# Patient Record
Sex: Female | Born: 1935 | Race: White | Hispanic: No | Smoking: Never smoker
Health system: Southern US, Community
[De-identification: ages and names within clinical notes are randomized; demographics above are authoritative.]

## PROBLEM LIST (undated history)

## (undated) DIAGNOSIS — K219 Gastro-esophageal reflux disease without esophagitis: Secondary | ICD-10-CM

## (undated) DIAGNOSIS — I2510 Atherosclerotic heart disease of native coronary artery without angina pectoris: Secondary | ICD-10-CM

## (undated) DIAGNOSIS — E785 Hyperlipidemia, unspecified: Secondary | ICD-10-CM

## (undated) DIAGNOSIS — Z95820 Peripheral vascular angioplasty status with implants and grafts: Secondary | ICD-10-CM

## (undated) DIAGNOSIS — I10 Essential (primary) hypertension: Secondary | ICD-10-CM

## (undated) DIAGNOSIS — M1990 Unspecified osteoarthritis, unspecified site: Secondary | ICD-10-CM

## (undated) DIAGNOSIS — I2109 ST elevation (STEMI) myocardial infarction involving other coronary artery of anterior wall: Secondary | ICD-10-CM

## (undated) HISTORY — DX: Peripheral vascular angioplasty status with implants and grafts: Z95.820

## (undated) HISTORY — DX: Atherosclerotic heart disease of native coronary artery without angina pectoris: I25.10

## (undated) HISTORY — PX: BALLOON ANGIOPLASTY, ARTERY: SHX564

## (undated) HISTORY — DX: Gastro-esophageal reflux disease without esophagitis: K21.9

## (undated) HISTORY — DX: Unspecified osteoarthritis, unspecified site: M19.90

---

## 2007-02-22 ENCOUNTER — Ambulatory Visit (HOSPITAL_BASED_OUTPATIENT_CLINIC_OR_DEPARTMENT_OTHER): Admission: RE | Admit: 2007-02-22 | Discharge: 2007-02-22 | Payer: Self-pay | Admitting: Orthopedic Surgery

## 2010-12-30 NOTE — Op Note (Signed)
NAME:  Sophia Snyder, Sophia Snyder                  ACCOUNT NO.:  [id number]   MEDICAL RECORD NO.:  [id number]          PATIENT TYPE:  AMB   LOCATION:  [place]                          FACILITY:  [facility]   PHYSICIAN:  Rodney A. Mortenson, M.D.DATE OF BIRTH:  [birth year]   DATE OF PROCEDURE:  02/22/2007  DATE OF DISCHARGE:                               OPERATIVE REPORT   PREOPERATIVE DIAGNOSIS:  Bimalleolar fracture right ankle with markedly  comminuted distal fibula fracture.   POSTOPERATIVE DIAGNOSIS:  Bimalleolar fracture right ankle with markedly  comminuted distal fibula fracture.   OPERATION:  Open induction internal fixation using 6-hole lateral side  plate over the fibula and a 30 mm length cannulated screw and washer  through the medial malleolus right ankle.   SURGEON:  Geroge Baseman. Alphonzo Cruise, M.D.   ANESTHESIA:  General.   PROCEDURE:  The patient placed on the table in supine position.  Pneumatic tourniquet the right upper thigh.  Right lower extremity  prepped with DuraPrep and draped in usual manner.  Leg was wrapped out  with Esmarch and tourniquet was elevated.  Attention was first turned to  the fibula.  Incision made down the fibula down to the tip of the  fibula.  This all done under radiograph control.  There is a markedly  comminuted fracture fibula at the level of the joint line.  There was a  large fragment with articular cartilage which was displaced out of the  joint.  This was placed back in anatomic position.  A six-hole side  plate was put in position, held in place with clamps.  The fracture was  reduced and stabilized.  Drill holes made and appropriate length screws  were then inserted.  Excellent fixation of the fracture was achieved and  repositioning of the fracture fragment of the fibula with articular  cartilage was repositioned in anatomic position.  This was done to my  satisfaction. Attention was turned to the medial side of the ankle.  Incision made over medial  malleolus but this was then reduced  anatomically with a small bone fragments were seen in the knee joint.  These were removed carefully.  With the medial malleolus in a reduced  position a guide wire was passed through the tip of the fibula up into  the distal tibia, and this was all done under radiographic control.  The  cannulated drill was then placed over the guide pin and a 30 mm  cannulated screw, washer was passed over the guide pin and this reduced  the fracture anatomically, gave excellent stabilization.  Follow-up x-  rays showed anatomic reduction of fractures.  The 2-0 Vicryl used to  close subcutaneous tissue, stainless steel staples used to close skin.  Sterile dressings, large bulky pressure dressing and plaster splints  were applied and the patient returned to recovery room in nice  condition.  Technically this went extremely well.  Drains none.  Complications none.           ______________________________  Geroge Baseman. Alphonzo Cruise, M.D.     RAM/MEDQ  D:  02/22/2007  T:  02/22/2007  Job:  [id number]

## 2011-06-02 LAB — I-STAT 8, (EC8 V) (CONVERTED LAB)
BUN: 17
Chloride: 106
pCO2, Ven: 40.6 — ABNORMAL LOW
pH, Ven: 7.405 — ABNORMAL HIGH

## 2014-10-25 DIAGNOSIS — Z23 Encounter for immunization: Secondary | ICD-10-CM | POA: Diagnosis not present

## 2014-10-25 DIAGNOSIS — Z9181 History of falling: Secondary | ICD-10-CM | POA: Diagnosis not present

## 2014-10-25 DIAGNOSIS — Z0001 Encounter for general adult medical examination with abnormal findings: Secondary | ICD-10-CM | POA: Diagnosis not present

## 2014-10-25 DIAGNOSIS — Z79899 Other long term (current) drug therapy: Secondary | ICD-10-CM | POA: Diagnosis not present

## 2014-10-25 DIAGNOSIS — Z2821 Immunization not carried out because of patient refusal: Secondary | ICD-10-CM | POA: Diagnosis not present

## 2015-06-20 DIAGNOSIS — J3089 Other allergic rhinitis: Secondary | ICD-10-CM | POA: Diagnosis not present

## 2015-06-20 DIAGNOSIS — I10 Essential (primary) hypertension: Secondary | ICD-10-CM | POA: Diagnosis not present

## 2015-06-20 DIAGNOSIS — Z1389 Encounter for screening for other disorder: Secondary | ICD-10-CM | POA: Diagnosis not present

## 2015-06-20 DIAGNOSIS — N183 Chronic kidney disease, stage 3 (moderate): Secondary | ICD-10-CM | POA: Diagnosis not present

## 2015-12-28 ENCOUNTER — Encounter (HOSPITAL_COMMUNITY): Payer: Self-pay | Admitting: Cardiovascular Disease

## 2015-12-28 ENCOUNTER — Encounter (HOSPITAL_COMMUNITY)
Admission: EM | Disposition: A | Discharge status: Home / Self Care | Payer: Self-pay | Source: Other Acute Inpatient Hospital | Attending: Cardiovascular Disease

## 2015-12-28 ENCOUNTER — Emergency Department (HOSPITAL_COMMUNITY): Admission: EM | Admit: 2015-12-28 | Payer: Self-pay | Admitting: Emergency Medicine

## 2015-12-28 ENCOUNTER — Inpatient Hospital Stay (HOSPITAL_COMMUNITY)
Admission: EM | Admit: 2015-12-28 | Discharge: 2016-01-01 | DRG: 246 | Disposition: A | Discharge status: Home / Self Care | Payer: Medicare Other | Source: Other Acute Inpatient Hospital | Attending: Cardiovascular Disease | Admitting: Cardiovascular Disease

## 2015-12-28 DIAGNOSIS — N179 Acute kidney failure, unspecified: Secondary | ICD-10-CM | POA: Diagnosis not present

## 2015-12-28 DIAGNOSIS — I2510 Atherosclerotic heart disease of native coronary artery without angina pectoris: Secondary | ICD-10-CM | POA: Diagnosis not present

## 2015-12-28 DIAGNOSIS — R0602 Shortness of breath: Secondary | ICD-10-CM

## 2015-12-28 DIAGNOSIS — I2582 Chronic total occlusion of coronary artery: Secondary | ICD-10-CM | POA: Diagnosis present

## 2015-12-28 DIAGNOSIS — Z7982 Long term (current) use of aspirin: Secondary | ICD-10-CM

## 2015-12-28 DIAGNOSIS — I5021 Acute systolic (congestive) heart failure: Secondary | ICD-10-CM | POA: Diagnosis present

## 2015-12-28 DIAGNOSIS — Z955 Presence of coronary angioplasty implant and graft: Secondary | ICD-10-CM | POA: Diagnosis not present

## 2015-12-28 DIAGNOSIS — I255 Ischemic cardiomyopathy: Secondary | ICD-10-CM | POA: Diagnosis not present

## 2015-12-28 DIAGNOSIS — J439 Emphysema, unspecified: Secondary | ICD-10-CM | POA: Diagnosis present

## 2015-12-28 DIAGNOSIS — I252 Old myocardial infarction: Secondary | ICD-10-CM

## 2015-12-28 DIAGNOSIS — I213 ST elevation (STEMI) myocardial infarction of unspecified site: Secondary | ICD-10-CM | POA: Diagnosis not present

## 2015-12-28 DIAGNOSIS — I2109 ST elevation (STEMI) myocardial infarction involving other coronary artery of anterior wall: Principal | ICD-10-CM

## 2015-12-28 DIAGNOSIS — I110 Hypertensive heart disease with heart failure: Secondary | ICD-10-CM | POA: Diagnosis present

## 2015-12-28 DIAGNOSIS — I2102 ST elevation (STEMI) myocardial infarction involving left anterior descending coronary artery: Secondary | ICD-10-CM

## 2015-12-28 DIAGNOSIS — I5022 Chronic systolic (congestive) heart failure: Secondary | ICD-10-CM

## 2015-12-28 DIAGNOSIS — E785 Hyperlipidemia, unspecified: Secondary | ICD-10-CM | POA: Diagnosis present

## 2015-12-28 DIAGNOSIS — I25110 Atherosclerotic heart disease of native coronary artery with unstable angina pectoris: Secondary | ICD-10-CM | POA: Diagnosis not present

## 2015-12-28 DIAGNOSIS — I10 Essential (primary) hypertension: Secondary | ICD-10-CM | POA: Diagnosis present

## 2015-12-28 DIAGNOSIS — R079 Chest pain, unspecified: Secondary | ICD-10-CM | POA: Diagnosis present

## 2015-12-28 DIAGNOSIS — I5020 Unspecified systolic (congestive) heart failure: Secondary | ICD-10-CM

## 2015-12-28 DIAGNOSIS — I509 Heart failure, unspecified: Secondary | ICD-10-CM

## 2015-12-28 DIAGNOSIS — Z95820 Peripheral vascular angioplasty status with implants and grafts: Secondary | ICD-10-CM | POA: Insufficient documentation

## 2015-12-28 HISTORY — DX: Old myocardial infarction: I25.2

## 2015-12-28 HISTORY — DX: Hyperlipidemia, unspecified: E78.5

## 2015-12-28 HISTORY — DX: ST elevation (STEMI) myocardial infarction involving other coronary artery of anterior wall: I21.09

## 2015-12-28 HISTORY — PX: CARDIAC CATHETERIZATION: SHX172

## 2015-12-28 HISTORY — DX: Essential (primary) hypertension: I10

## 2015-12-28 HISTORY — DX: Peripheral vascular angioplasty status with implants and grafts: Z95.820

## 2015-12-28 LAB — CREATININE, SERUM
Creatinine, Ser: 1.2 mg/dL — ABNORMAL HIGH (ref 0.44–1.00)
GFR calc Af Amer: 43 mL/min — ABNORMAL LOW (ref >60)
GFR calc non Af Amer: 37 mL/min — ABNORMAL LOW (ref >60)

## 2015-12-28 LAB — POCT I-STAT, CHEM 8
BUN: 17 mg/dL (ref 6–20)
CALCIUM ION: 1.18 mmol/L (ref 1.13–1.30)
CHLORIDE: 104 mmol/L (ref 101–111)
CREATININE: 1.1 mg/dL — AB (ref 0.44–1.00)
Glucose, Bld: 160 mg/dL — ABNORMAL HIGH (ref 65–99)
HEMATOCRIT: 36 % (ref 36.0–46.0)
Hemoglobin: 12.2 g/dL (ref 12.0–15.0)
Potassium: 4 mmol/L (ref 3.5–5.1)
SODIUM: 137 mmol/L (ref 135–145)
TCO2: 22 mmol/L (ref 0–100)

## 2015-12-28 LAB — COMPREHENSIVE METABOLIC PANEL
ALBUMIN: 3.7 g/dL (ref 3.5–5.0)
ALK PHOS: 59 U/L (ref 38–126)
ALT: 13 U/L — ABNORMAL LOW (ref 14–54)
AST: 36 U/L (ref 15–41)
Anion gap: 13 (ref 5–15)
BILIRUBIN TOTAL: 0.6 mg/dL (ref 0.3–1.2)
BUN: 17 mg/dL (ref 6–20)
CALCIUM: 9 mg/dL (ref 8.9–10.3)
CO2: 20 mmol/L — ABNORMAL LOW (ref 22–32)
CREATININE: 1.13 mg/dL — AB (ref 0.44–1.00)
Chloride: 103 mmol/L (ref 101–111)
GFR calc Af Amer: 47 mL/min — ABNORMAL LOW (ref >60)
GFR calc non Af Amer: 40 mL/min — ABNORMAL LOW (ref >60)
GLUCOSE: 163 mg/dL — AB (ref 65–99)
Potassium: 3.9 mmol/L (ref 3.5–5.1)
Sodium: 136 mmol/L (ref 135–145)
TOTAL PROTEIN: 6.8 g/dL (ref 6.5–8.1)

## 2015-12-28 LAB — LIPID PANEL
CHOL/HDL RATIO: 5.2 ratio
Cholesterol: 256 mg/dL — ABNORMAL HIGH (ref 0–200)
HDL: 49 mg/dL (ref >40)
LDL CALC: 181 mg/dL — AB (ref 0–99)
Triglycerides: 130 mg/dL (ref <150)
VLDL: 26 mg/dL (ref 0–40)

## 2015-12-28 LAB — CBC
HEMATOCRIT: 34.9 % — AB (ref 36.0–46.0)
HEMATOCRIT: 36.3 % (ref 36.0–46.0)
HEMOGLOBIN: 11.5 g/dL — AB (ref 12.0–15.0)
Hemoglobin: 12 g/dL (ref 12.0–15.0)
MCH: 30.3 pg (ref 26.0–34.0)
MCH: 30.9 pg (ref 26.0–34.0)
MCHC: 33 g/dL (ref 30.0–36.0)
MCHC: 33.1 g/dL (ref 30.0–36.0)
MCV: 91.8 fL (ref 78.0–100.0)
MCV: 93.6 fL (ref 78.0–100.0)
Platelets: 239 10*3/uL (ref 150–400)
Platelets: 199 10*3/uL (ref 150–400)
RBC: 3.8 MIL/uL — ABNORMAL LOW (ref 3.87–5.11)
RBC: 3.88 MIL/uL (ref 3.87–5.11)
RDW: 13.6 % (ref 11.5–15.5)
RDW: 13.7 % (ref 11.5–15.5)
WBC: 7.9 10*3/uL (ref 4.0–10.5)
WBC: 10.3 10*3/uL (ref 4.0–10.5)

## 2015-12-28 LAB — MRSA PCR SCREENING: MRSA by PCR: NEGATIVE

## 2015-12-28 LAB — PROTIME-INR
INR: 1.16 (ref 0.00–1.49)
Prothrombin Time: 15 seconds (ref 11.6–15.2)

## 2015-12-28 LAB — TROPONIN I: TROPONIN I: 0.87 ng/mL — AB (ref <0.031)

## 2015-12-28 LAB — CK TOTAL AND CKMB (NOT AT ARMC)
CK, MB: 19.8 ng/mL — ABNORMAL HIGH (ref 0.5–5.0)
Relative Index: 10.4 — ABNORMAL HIGH (ref 0.0–2.5)
Total CK: 190 U/L (ref 38–234)

## 2015-12-28 LAB — TSH: TSH: 4.671 u[IU]/mL — ABNORMAL HIGH (ref 0.350–4.500)

## 2015-12-28 LAB — POCT ACTIVATED CLOTTING TIME: Activated Clotting Time: 498 seconds

## 2015-12-28 LAB — BRAIN NATRIURETIC PEPTIDE: B NATRIURETIC PEPTIDE 5: 283.3 pg/mL — AB (ref 0.0–100.0)

## 2015-12-28 LAB — APTT: aPTT: >200 seconds (ref 24–37)

## 2015-12-28 SURGERY — LEFT HEART CATH AND CORONARY ANGIOGRAPHY
Anesthesia: LOCAL

## 2015-12-28 MED ORDER — ASPIRIN EC 81 MG PO TBEC
81.0000 mg | DELAYED_RELEASE_TABLET | Freq: Every day | ORAL | Status: DC
  Administered 2015-12-29 – 2016-01-01 (×4): 81 mg via ORAL
  Filled 2015-12-28 (×5): qty 1

## 2015-12-28 MED ORDER — IOPAMIDOL (ISOVUE-370) INJECTION 76%
INTRAVENOUS | Status: DC | PRN
  Administered 2015-12-28: 180 mL

## 2015-12-28 MED ORDER — FENTANYL CITRATE (PF) 100 MCG/2ML IJ SOLN
INTRAMUSCULAR | Status: DC | PRN
  Administered 2015-12-28 (×2): 25 ug via INTRAVENOUS

## 2015-12-28 MED ORDER — IOPAMIDOL (ISOVUE-370) INJECTION 76%
INTRAVENOUS | Status: AC
  Filled 2015-12-28: qty 200

## 2015-12-28 MED ORDER — NITROGLYCERIN 1 MG/10 ML FOR IR/CATH LAB
INTRA_ARTERIAL | Status: AC
  Filled 2015-12-28: qty 10

## 2015-12-28 MED ORDER — HEPARIN SODIUM (PORCINE) 1000 UNIT/ML IJ SOLN
INTRAMUSCULAR | Status: DC | PRN
  Administered 2015-12-28: 4000 [IU] via INTRAVENOUS

## 2015-12-28 MED ORDER — VERAPAMIL HCL 2.5 MG/ML IV SOLN
INTRAVENOUS | Status: DC | PRN
  Administered 2015-12-28: 100 ug via INTRACORONARY
  Administered 2015-12-28 (×3): 150 ug via INTRACORONARY

## 2015-12-28 MED ORDER — ONDANSETRON HCL 4 MG/2ML IJ SOLN
4.0000 mg | Freq: Four times a day (QID) | INTRAMUSCULAR | Status: DC | PRN
  Administered 2015-12-29: 4 mg via INTRAVENOUS
  Filled 2015-12-28: qty 2

## 2015-12-28 MED ORDER — LISINOPRIL 10 MG PO TABS
10.0000 mg | ORAL_TABLET | Freq: Every day | ORAL | Status: DC
  Administered 2015-12-28 – 2015-12-30 (×4): 10 mg via ORAL
  Filled 2015-12-28 (×4): qty 1

## 2015-12-28 MED ORDER — MORPHINE SULFATE (PF) 2 MG/ML IV SOLN: 1.0000 mg | INTRAVENOUS | Status: DC | PRN

## 2015-12-28 MED ORDER — SODIUM CHLORIDE 0.9% FLUSH
3.0000 mL | Freq: Two times a day (BID) | INTRAVENOUS | Status: DC
  Administered 2015-12-29 – 2016-01-01 (×7): 3 mL via INTRAVENOUS

## 2015-12-28 MED ORDER — ATORVASTATIN CALCIUM 80 MG PO TABS
80.0000 mg | ORAL_TABLET | Freq: Every day | ORAL | Status: DC
  Administered 2015-12-29 – 2015-12-31 (×3): 80 mg via ORAL
  Filled 2015-12-28 (×3): qty 1

## 2015-12-28 MED ORDER — ONDANSETRON HCL 4 MG/2ML IJ SOLN
INTRAMUSCULAR | Status: DC | PRN
  Administered 2015-12-28: 4 mg via INTRAVENOUS

## 2015-12-28 MED ORDER — MIDAZOLAM HCL 2 MG/2ML IJ SOLN
INTRAMUSCULAR | Status: AC
  Filled 2015-12-28: qty 2

## 2015-12-28 MED ORDER — SODIUM CHLORIDE 0.9 % IV SOLN: 250.0000 mL | INTRAVENOUS | Status: DC | PRN

## 2015-12-28 MED ORDER — ASPIRIN 300 MG RE SUPP: 300.0000 mg | RECTAL | Status: DC

## 2015-12-28 MED ORDER — VERAPAMIL HCL 2.5 MG/ML IV SOLN
INTRAVENOUS | Status: AC
  Filled 2015-12-28: qty 2

## 2015-12-28 MED ORDER — ONDANSETRON HCL 4 MG/2ML IJ SOLN
INTRAMUSCULAR | Status: AC
  Filled 2015-12-28: qty 2

## 2015-12-28 MED ORDER — NITROGLYCERIN 0.4 MG SL SUBL
0.4000 mg | SUBLINGUAL_TABLET | SUBLINGUAL | Status: DC | PRN
Start: 2015-12-28 — End: 2016-01-01

## 2015-12-28 MED ORDER — NITROGLYCERIN 1 MG/10 ML FOR IR/CATH LAB
INTRA_ARTERIAL | Status: DC | PRN
  Administered 2015-12-28: 150 ug via INTRACORONARY

## 2015-12-28 MED ORDER — HEPARIN (PORCINE) IN NACL 2-0.9 UNIT/ML-% IJ SOLN
INTRAMUSCULAR | Status: AC
  Filled 2015-12-28: qty 1500

## 2015-12-28 MED ORDER — MIDAZOLAM HCL 2 MG/2ML IJ SOLN
INTRAMUSCULAR | Status: DC | PRN
  Administered 2015-12-28 (×2): 1 mg via INTRAVENOUS

## 2015-12-28 MED ORDER — CLOPIDOGREL BISULFATE 300 MG PO TABS
ORAL_TABLET | ORAL | Status: DC | PRN
  Administered 2015-12-28: 600 mg via ORAL

## 2015-12-28 MED ORDER — SODIUM CHLORIDE 0.9% FLUSH: 3.0000 mL | INTRAVENOUS | Status: DC | PRN

## 2015-12-28 MED ORDER — BIVALIRUDIN 250 MG IV SOLR
INTRAVENOUS | Status: AC
Start: 2015-12-28 — End: 2015-12-28
  Filled 2015-12-28: qty 250

## 2015-12-28 MED ORDER — OXYCODONE-ACETAMINOPHEN 5-325 MG PO TABS: 1.0000 | ORAL_TABLET | ORAL | Status: DC | PRN

## 2015-12-28 MED ORDER — ENOXAPARIN SODIUM 30 MG/0.3ML ~~LOC~~ SOLN
30.0000 mg | SUBCUTANEOUS | Status: DC
  Administered 2015-12-29 – 2016-01-01 (×4): 30 mg via SUBCUTANEOUS
  Filled 2015-12-28 (×4): qty 0.3

## 2015-12-28 MED ORDER — FENTANYL CITRATE (PF) 100 MCG/2ML IJ SOLN
INTRAMUSCULAR | Status: AC
Start: 2015-12-28 — End: 2015-12-28
  Filled 2015-12-28: qty 2

## 2015-12-28 MED ORDER — HEPARIN (PORCINE) IN NACL 2-0.9 UNIT/ML-% IJ SOLN
INTRAMUSCULAR | Status: DC | PRN
  Administered 2015-12-28: 16:00:00

## 2015-12-28 MED ORDER — LIDOCAINE HCL (PF) 1 % IJ SOLN
INTRAMUSCULAR | Status: DC | PRN
  Administered 2015-12-28: 4 mL

## 2015-12-28 MED ORDER — SODIUM CHLORIDE 0.9% FLUSH
3.0000 mL | Freq: Two times a day (BID) | INTRAVENOUS | Status: DC
  Administered 2015-12-28 – 2016-01-01 (×7): 3 mL via INTRAVENOUS

## 2015-12-28 MED ORDER — BIVALIRUDIN BOLUS VIA INFUSION - CUPID
INTRAVENOUS | Status: DC | PRN
  Administered 2015-12-28: 45 mg via INTRAVENOUS

## 2015-12-28 MED ORDER — SODIUM CHLORIDE 0.9 % IV SOLN
250.0000 mg | INTRAVENOUS | Status: DC | PRN
  Administered 2015-12-28: 1.75 mg/kg/h via INTRAVENOUS

## 2015-12-28 MED ORDER — CLOPIDOGREL BISULFATE 75 MG PO TABS
75.0000 mg | ORAL_TABLET | Freq: Every day | ORAL | Status: DC
  Administered 2015-12-29 – 2016-01-01 (×4): 75 mg via ORAL
  Filled 2015-12-28 (×4): qty 1

## 2015-12-28 MED ORDER — CLOPIDOGREL BISULFATE 300 MG PO TABS
ORAL_TABLET | ORAL | Status: AC
  Filled 2015-12-28: qty 2

## 2015-12-28 MED ORDER — ASPIRIN 81 MG PO CHEW: 324.0000 mg | CHEWABLE_TABLET | ORAL | Status: DC

## 2015-12-28 MED ORDER — HEPARIN SODIUM (PORCINE) 1000 UNIT/ML IJ SOLN
INTRAMUSCULAR | Status: AC
  Filled 2015-12-28: qty 1

## 2015-12-28 MED ORDER — VERAPAMIL HCL 2.5 MG/ML IV SOLN
INTRAVENOUS | Status: DC | PRN
  Administered 2015-12-28: 10 mL via INTRA_ARTERIAL

## 2015-12-28 MED ORDER — ACETAMINOPHEN 325 MG PO TABS
650.0000 mg | ORAL_TABLET | ORAL | Status: DC | PRN
  Administered 2015-12-28: 650 mg via ORAL
  Filled 2015-12-28: qty 2

## 2015-12-28 SURGICAL SUPPLY — 28 items
BALLN EMERGE MR 2.0X12 (BALLOONS) ×2
BALLN EMERGE MR 2.0X15 (BALLOONS) ×2
BALLN ~~LOC~~ EMERGE MR 3.25X20 (BALLOONS) ×2
BALLN ~~LOC~~ EUPHORA RX 2.75X20 (BALLOONS) ×2
BALLOON EMERGE MR 2.0X12 (BALLOONS) ×1 IMPLANT
BALLOON EMERGE MR 2.0X15 (BALLOONS) ×1 IMPLANT
BALLOON ~~LOC~~ EMERGE MR 3.25X20 (BALLOONS) ×1 IMPLANT
BALLOON ~~LOC~~ EUPHORA RX 2.75X20 (BALLOONS) ×1 IMPLANT
CATH INFINITI 5FR ANG PIGTAIL (CATHETERS) ×2 IMPLANT
CATH INFINITI JR4 5F (CATHETERS) ×2 IMPLANT
CATH MICROGUIDE FINCRSS 150 CM (MICROCATHETER) ×1 IMPLANT
CATH VISTA GUIDE 6FR XBLAD3.5 (CATHETERS) ×2 IMPLANT
DEVICE RAD COMP TR BAND LRG (VASCULAR PRODUCTS) ×2 IMPLANT
ELECT DEFIB PAD ADLT CADENCE (PAD) ×2 IMPLANT
GLIDESHEATH SLEND SS 6F .021 (SHEATH) ×2 IMPLANT
KIT ENCORE 26 ADVANTAGE (KITS) ×2 IMPLANT
KIT HEART LEFT (KITS) ×2 IMPLANT
MICROGUIDE FINECROSS 150 CM (MICROCATHETER) ×2
PACK CARDIAC CATHETERIZATION (CUSTOM PROCEDURE TRAY) ×2 IMPLANT
STENT SYNERGY DES 2.5X32 (Permanent Stent) ×2 IMPLANT
STENT SYNERGY DES 3X28 (Permanent Stent) ×2 IMPLANT
SYR MEDRAD MARK V 150ML (SYRINGE) ×2 IMPLANT
TRANSDUCER W/STOPCOCK (MISCELLANEOUS) ×2 IMPLANT
TUBING CIL FLEX 10 FLL-RA (TUBING) ×2 IMPLANT
WIRE COUGAR XT STRL 190CM (WIRE) ×2 IMPLANT
WIRE HI TORQ VERSACORE-J 145CM (WIRE) ×2 IMPLANT
WIRE HI TORQ WHISPER MS 190CM (WIRE) ×2 IMPLANT
WIRE SAFE-T 1.5MM-J .035X260CM (WIRE) ×2 IMPLANT

## 2015-12-28 NOTE — H&P (Signed)
History and Physical  Patient ID: Sophia Snyder MRN: [id number], SOB: 02-19-1919 82 y.o. Date of Encounter: 12/28/2015, 6:30 PM  Primary Physician: [facility] Primary Cardiologist: none  Chief Complaint: Chest pain  HPI: 82 y.o. female w/ PMHx significant for HTN who presented to [facility] on 12/28/2015 with complaints of chest pain. She is brought in by [facility] as a Code STEMI.  The patient began developing substernal chest discomfort 2 days ago. She initially thought she had a cold. Symptoms were intermittent. This morning she developed severe substernal discomfort associated with diaphoresis and she called EMS. Upon their evaluation, her EKG showed ST segment changes consistent with an acute anterior STEMI. A code STEMI is called and she is brought directly to the cardiac catheterization lab. I evaluated the patient on arrival to the hospital. She continues to complain of 5/10 chest pain. She lives independently with her grandson. 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 She has been in generally good health. She takes medication for hypertension and she takes a daily aspirin.  The patient denies dyspnea, orthopnea, PND, lightheadedness, or syncope.  Past Medical History  Diagnosis Date  . ST elevation myocardial infarction (STEMI) of anterior wall ([place]) 12/28/2015    Surgical History: History reviewed. No pertinent past surgical history.   Home Meds: Prior to Admission medications   Not on File   Allergies: Not on File  Social History   Social History  . Marital Status: Widowed    Spouse Name: N/A  . Number of Children: N/A  . Years of Education: N/A   Occupational History  . Not on file.   Social History Main Topics  . Smoking status: Never Smoker   . Smokeless tobacco: Not on file  . Alcohol Use: No  . Drug Use: No  . Sexual Activity: No   Other Topics Concern  . Not on file   Social History Narrative  . No  narrative on file     Family history: 2 brothers had heart problems, specifics unknown  Review of Systems: General: negative for chills, fever, night sweats or weight changes.  ENT: negative for rhinorrhea or epistaxis Cardiovascular: see HPI Dermatological: negative for rash Respiratory: negative for cough or wheezing GI: negative for nausea, vomiting, diarrhea, bright red blood per rectum, melena, or hematemesis GU: no hematuria, urgency, or frequency Neurologic: negative for visual changes, syncope, headache, or dizziness Heme: no easy bruising or bleeding Endo: negative for excessive thirst, thyroid disorder, or flushing Musculoskeletal: positive for back pain All other systems reviewed and are otherwise negative except as noted above.  Physical Exam: Blood pressure 129/88, pulse 86, resp. rate 20, height 5\' 1"  (1.549 m), weight 140 lb 3.4 oz (63.6 kg), SpO2 91 %. General: Well developed, well nourished, alert and oriented, in no acute distress. HEENT: Normocephalic, atraumatic, sclera anicteric Neck: Supple. Carotids 2+ without bruits. JVP normal Lungs: Clear bilaterally to auscultation without wheezes, rales, or rhonchi. Breathing is unlabored. Heart: RRR with normal S1 and S2. No murmurs, rubs, or gallops appreciated. Abdomen: Soft, non-tender, non-distended with normoactive bowel sounds. No hepatomegaly. No rebound/guarding. No obvious abdominal masses. Back: No CVA tenderness Msk:  Strength and tone appear normal for age. Extremities: No clubbing, cyanosis, or edema.  Distal pedal pulses are 2+ and equal bilaterally. Neuro: CNII-XII intact, moves all extremities spontaneously. Psych:  Responds to questions appropriately with a normal affect. Skin: warm and dry without rash  Labs:   Lab Results  Component Value Date   WBC 7.9 12/28/2015   HGB 11.5* 12/28/2015   HCT 34.9* 12/28/2015   MCV 91.8 12/28/2015   PLT 239 12/28/2015     Recent Labs Lab 12/28/15 1545    NA 136  K 3.9  CL 103  CO2 20*  BUN 17  CREATININE 1.13*  CALCIUM 9.0  PROT 6.8  BILITOT 0.6  ALKPHOS 59  ALT 13*  AST 36  GLUCOSE 163*    Recent Labs  12/28/15 1545  CKTOTAL 190  CKMB 19.8*  TROPONINI 0.87*   Lab Results  Component Value Date   CHOL 256* 12/28/2015   HDL 49 12/28/2015   LDLCALC 181* 12/28/2015   TRIG 130 12/28/2015   No results found for: DDIMER  Radiology/Studies:  No results found.   EKG: Normal sinus rhythm with acute anterior injury pattern  CARDIAC STUDIES: Pending  ASSESSMENT AND PLAN:  Acute anterior STEMI: The patient is an elderly but functional independent woman who clearly has evidence of ongoing anterior MI. She currently has 5/10 chest pain. We discussed treatment options with the indication for cardiac catheterization and primary PCI. Emergency implied consent is obtained. The patient understands the risks, potential benefits, and alternatives. I tried to call her daughter but the number listed was a wrong number. EMS reports discussion with her daughter who understood that the patient was being brought to the cardiac catheterization lab directly. We are still awaiting her arrival.  The patient will be given 4000 units unfractionated heparin. She is given clopidogrel 600 mg. She has received aspirin. Further plans/disposition pending cardiac catheterization results. Post MI medical therapy will be initiated.  Deatra James MD 12/28/2015, 6:30 PM

## 2015-12-29 ENCOUNTER — Inpatient Hospital Stay (HOSPITAL_COMMUNITY): Payer: Medicare Other

## 2015-12-29 DIAGNOSIS — I5021 Acute systolic (congestive) heart failure: Secondary | ICD-10-CM

## 2015-12-29 LAB — LIPID PANEL
CHOL/HDL RATIO: 4.9 ratio
CHOLESTEROL: 257 mg/dL — AB (ref 0–200)
HDL: 52 mg/dL (ref >40)
LDL Cholesterol: 178 mg/dL — ABNORMAL HIGH (ref 0–99)
TRIGLYCERIDES: 134 mg/dL (ref <150)
VLDL: 27 mg/dL (ref 0–40)

## 2015-12-29 LAB — TROPONIN I: Troponin I: >65 ng/mL (ref <0.031)

## 2015-12-29 LAB — CBC
HCT: 36.6 % (ref 36.0–46.0)
Hemoglobin: 12 g/dL (ref 12.0–15.0)
MCH: 30.6 pg (ref 26.0–34.0)
MCHC: 32.8 g/dL (ref 30.0–36.0)
MCV: 93.4 fL (ref 78.0–100.0)
PLATELETS: 201 10*3/uL (ref 150–400)
RBC: 3.92 MIL/uL (ref 3.87–5.11)
RDW: 14 % (ref 11.5–15.5)
WBC: 10.1 10*3/uL (ref 4.0–10.5)

## 2015-12-29 LAB — BASIC METABOLIC PANEL
Anion gap: 10 (ref 5–15)
BUN: 13 mg/dL (ref 6–20)
CALCIUM: 9.2 mg/dL (ref 8.9–10.3)
CO2: 23 mmol/L (ref 22–32)
CREATININE: 1.24 mg/dL — AB (ref 0.44–1.00)
Chloride: 103 mmol/L (ref 101–111)
GFR calc Af Amer: 42 mL/min — ABNORMAL LOW (ref >60)
GFR calc non Af Amer: 36 mL/min — ABNORMAL LOW (ref >60)
GLUCOSE: 125 mg/dL — AB (ref 65–99)
Potassium: 4.9 mmol/L (ref 3.5–5.1)
Sodium: 136 mmol/L (ref 135–145)

## 2015-12-29 MED ORDER — CETYLPYRIDINIUM CHLORIDE 0.05 % MT LIQD
7.0000 mL | Freq: Two times a day (BID) | OROMUCOSAL | Status: DC
  Administered 2015-12-29 – 2015-12-31 (×6): 7 mL via OROMUCOSAL

## 2015-12-29 MED ORDER — CARVEDILOL 3.125 MG PO TABS
3.1250 mg | ORAL_TABLET | Freq: Two times a day (BID) | ORAL | Status: DC
  Administered 2015-12-29 – 2016-01-01 (×7): 3.125 mg via ORAL
  Filled 2015-12-29 (×8): qty 1

## 2015-12-29 MED ORDER — FUROSEMIDE 10 MG/ML IJ SOLN
40.0000 mg | Freq: Two times a day (BID) | INTRAMUSCULAR | Status: DC
  Administered 2015-12-29 (×2): 40 mg via INTRAVENOUS
  Filled 2015-12-29 (×2): qty 4

## 2015-12-29 NOTE — Progress Notes (Signed)
Utilization review completed.  

## 2015-12-29 NOTE — Progress Notes (Signed)
BP slightly elevated HR in the 90s w/ noted dyspnea w/ exertion,pt take Lisinopril at HS as verbalized for BP-notified Dr Radford Pax w/ order for 10 mg Lisinopril Q HS.

## 2015-12-29 NOTE — Progress Notes (Addendum)
Patient ID: DAZANI FELTMAN, female   DOB: [birth year], 82 y.o.   MRN: [id number]   SUBJECTIVE: No further chest pain.  Mild dyspnea.    Scheduled Meds: . antiseptic oral rinse  7 mL Mouth Rinse BID  . aspirin EC  81 mg Oral Daily  . atorvastatin  80 mg Oral q1800  . carvedilol  3.125 mg Oral BID WC  . clopidogrel  75 mg Oral Q breakfast  . enoxaparin (LOVENOX) injection  30 mg Subcutaneous Q24H  . furosemide  40 mg Intravenous BID  . lisinopril  10 mg Oral QHS  . sodium chloride flush  3 mL Intravenous Q12H  . sodium chloride flush  3 mL Intravenous Q12H   Continuous Infusions:  PRN Meds:.sodium chloride, sodium chloride, acetaminophen, morphine injection, nitroGLYCERIN, ondansetron (ZOFRAN) IV, oxyCODONE-acetaminophen, sodium chloride flush, sodium chloride flush    Filed Vitals:   12/29/15 0400 12/29/15 0500 12/29/15 0600 12/29/15 0700  BP: 134/91  131/71   Pulse: 91 89 92 84  Temp: 98.5 F (36.9 C)   97.9 F (36.6 C)  TempSrc: Oral   Oral  Resp: 18 17 25 15   Height:      Weight:      SpO2: 96% 97% 96% 97%    Intake/Output Summary (Last 24 hours) at 12/29/15 0859 Last data filed at 12/29/15 0748  Gross per 24 hour  Intake    395 ml  Output    550 ml  Net   -155 ml    LABS: Basic Metabolic Panel:  Recent Labs  12/28/15 1545 12/28/15 1546 12/28/15 1752 12/29/15 0528  NA 136 137  --  136  K 3.9 4.0  --  4.9  CL 103 104  --  103  CO2 20*  --   --  23  GLUCOSE 163* 160*  --  125*  BUN 17 17  --  13  CREATININE 1.13* 1.10* 1.20* 1.24*  CALCIUM 9.0  --   --  9.2   Liver Function Tests:  Recent Labs  12/28/15 1545  AST 36  ALT 13*  ALKPHOS 59  BILITOT 0.6  PROT 6.8  ALBUMIN 3.7   No results for input(s): LIPASE, AMYLASE in the last 72 hours. CBC:  Recent Labs  12/28/15 1752 12/29/15 0528  WBC 10.3 10.1  HGB 12.0 12.0  HCT 36.3 36.6  MCV 93.6 93.4  PLT 199 201   Cardiac Enzymes:  Recent Labs  12/28/15 1545 12/28/15 1752 12/28/15 2329  12/29/15 0528  CKTOTAL 190  --   --   --   CKMB 19.8*  --   --   --   TROPONINI 0.87* >65.00* >65.00* >65.00*   BNP: Invalid input(s): POCBNP D-Dimer: No results for input(s): DDIMER in the last 72 hours. Hemoglobin A1C: No results for input(s): HGBA1C in the last 72 hours. Fasting Lipid Panel:  Recent Labs  12/29/15 0528  CHOL 257*  HDL 52  LDLCALC 178*  TRIG 134  CHOLHDL 4.9   Thyroid Function Tests:  Recent Labs  12/28/15 1752  TSH 4.671*   Anemia Panel: No results for input(s): VITAMINB12, FOLATE, FERRITIN, TIBC, IRON, RETICCTPCT in the last 72 hours.  RADIOLOGY: No results found.  PHYSICAL EXAM General: NAD Neck: JVP 10 cm, no thyromegaly or thyroid nodule.  Lungs: Clear to auscultation bilaterally with normal respiratory effort. CV: Nondisplaced PMI.  Heart regular S1/S2, no S3/S4, no murmur.  No peripheral edema.    Abdomen: Soft, nontender, no hepatosplenomegaly, no distention.  Neurologic: Alert and oriented x 3.  Psych: Normal affect. Extremities: No clubbing or cyanosis.   TELEMETRY: Reviewed telemetry pt in NSR in 80s  ASSESSMENT AND PLAN: 82 yo with history of HTN was admitted 5/13 with anterior STEMI.   1. CAD: Anterior STEMI.  LHC with chronic RCA occlusion with L=> R collaterals and new total occlusion prox LAD.  Patient had DES to pLAD.  No further CP, ECG today shows some recovery of ST elevation.  - ASA, Plavix, statin.  - Cardiac rehab.  2. Acute systolic CHF: EF 0000000 by LV-gram.  LVEDP 22 at cath, she has dyspnea with movement and volume overload on exam.  - Lasix 40 mg IV bid for now.   - Continue lisinopril.  - Think we can add low dose Coreg 3.125 mg bid.  - Will add spironolactone daily tomorrow, hold off this morning with K 4.9.  - Will get full echo.  Loralie Champagne 12/29/2015 9:03 AM

## 2015-12-30 ENCOUNTER — Encounter (HOSPITAL_COMMUNITY): Payer: Self-pay | Admitting: Cardiovascular Disease

## 2015-12-30 ENCOUNTER — Inpatient Hospital Stay (HOSPITAL_COMMUNITY): Payer: Medicare Other

## 2015-12-30 DIAGNOSIS — I213 ST elevation (STEMI) myocardial infarction of unspecified site: Secondary | ICD-10-CM

## 2015-12-30 DIAGNOSIS — I25110 Atherosclerotic heart disease of native coronary artery with unstable angina pectoris: Secondary | ICD-10-CM

## 2015-12-30 DIAGNOSIS — I2109 ST elevation (STEMI) myocardial infarction involving other coronary artery of anterior wall: Secondary | ICD-10-CM

## 2015-12-30 DIAGNOSIS — I10 Essential (primary) hypertension: Secondary | ICD-10-CM

## 2015-12-30 DIAGNOSIS — I255 Ischemic cardiomyopathy: Secondary | ICD-10-CM

## 2015-12-30 DIAGNOSIS — E785 Hyperlipidemia, unspecified: Secondary | ICD-10-CM

## 2015-12-30 HISTORY — DX: Hyperlipidemia, unspecified: E78.5

## 2015-12-30 HISTORY — DX: Essential (primary) hypertension: I10

## 2015-12-30 LAB — CBC
HEMATOCRIT: 37.6 % (ref 36.0–46.0)
Hemoglobin: 12.8 g/dL (ref 12.0–15.0)
MCH: 31.5 pg (ref 26.0–34.0)
MCHC: 34 g/dL (ref 30.0–36.0)
MCV: 92.6 fL (ref 78.0–100.0)
PLATELETS: 192 10*3/uL (ref 150–400)
RBC: 4.06 MIL/uL (ref 3.87–5.11)
RDW: 14 % (ref 11.5–15.5)
WBC: 8.9 10*3/uL (ref 4.0–10.5)

## 2015-12-30 LAB — ECHOCARDIOGRAM COMPLETE
Height: 61 in
Weight: 2119.94 oz

## 2015-12-30 LAB — BASIC METABOLIC PANEL
Anion gap: 10 (ref 5–15)
BUN: 17 mg/dL (ref 6–20)
CO2: 25 mmol/L (ref 22–32)
CREATININE: 1.36 mg/dL — AB (ref 0.44–1.00)
Calcium: 9.1 mg/dL (ref 8.9–10.3)
Chloride: 98 mmol/L — ABNORMAL LOW (ref 101–111)
GFR calc Af Amer: 37 mL/min — ABNORMAL LOW (ref >60)
GFR, EST NON AFRICAN AMERICAN: 32 mL/min — AB (ref >60)
GLUCOSE: 94 mg/dL (ref 65–99)
Potassium: 4.1 mmol/L (ref 3.5–5.1)
SODIUM: 133 mmol/L — AB (ref 135–145)

## 2015-12-30 LAB — TROPONIN I: Troponin I: >65 ng/mL (ref <0.031)

## 2015-12-30 LAB — HEMOGLOBIN A1C
HEMOGLOBIN A1C: 5.7 % — AB (ref 4.8–5.6)
Hgb A1c MFr Bld: 5.4 % (ref 4.8–5.6)
MEAN PLASMA GLUCOSE: 108 mg/dL
Mean Plasma Glucose: 117 mg/dL

## 2015-12-30 MED ORDER — FUROSEMIDE 40 MG PO TABS: 40.0000 mg | ORAL_TABLET | Freq: Every day | ORAL | Status: DC

## 2015-12-30 MED ORDER — FUROSEMIDE 10 MG/ML IJ SOLN
40.0000 mg | Freq: Two times a day (BID) | INTRAMUSCULAR | Status: DC
  Administered 2015-12-30 (×2): 40 mg via INTRAVENOUS
  Filled 2015-12-30 (×3): qty 4

## 2015-12-30 NOTE — Progress Notes (Signed)
  Echocardiogram 2D Echocardiogram has been performed.  Bobbye Charleston 12/30/2015, 10:36 AM

## 2015-12-30 NOTE — Progress Notes (Signed)
Patient Name: Sophia Snyder Date of Encounter: 12/30/2015  Principal Problem:   Acute ST elevation myocardial infarction (STEMI) of anterolateral wall ([facility]) Active Problems:   ST elevation myocardial infarction (STEMI) of anterior wall ([place])   Hyperlipidemia LDL goal <70   Essential hypertension   Primary Cardiologist: new, Dr Burt Knack  Patient Profile: 82 yo female w/ hx HTN, tx from [place] as code STEMI, cath w/  PCI LAD, med rx for 100% RCA  SUBJECTIVE: No chest pain, sat up a while yesterday, breathing much better, now off O2  OBJECTIVE Filed Vitals:   12/30/15 0500 12/30/15 0600 12/30/15 0630 12/30/15 0700  BP:  101/62    Pulse: 74 76 80 85  Temp:      TempSrc:      Resp: 13 17 14 12   Height:      Weight:  132 lb 7.9 oz (60.1 kg)    SpO2: 96% 96% 93% 95%    Intake/Output Summary (Last 24 hours) at 12/30/15 0738 Last data filed at 12/30/15 0130  Gross per 24 hour  Intake    400 ml  Output   1475 ml  Net  -1075 ml   Filed Weights   12/28/15 1730 12/30/15 0600  Weight: 140 lb 3.4 oz (63.6 kg) 132 lb 7.9 oz (60.1 kg)    PHYSICAL EXAM General: Well developed, well nourished, female in no acute distress. Head: Normocephalic, atraumatic.  Neck: Supple without bruits, JVD not elevated. Lungs:  Resp regular and unlabored, few rales bases. Heart: RRR, S1, S2, no S3, S4, or murmur; no rub. Abdomen: Soft, non-tender, non-distended, BS + x 4.  Extremities: No clubbing, cyanosis, edema.  Neuro: Alert and oriented X 3. Moves all extremities spontaneously. Psych: Normal affect.  LABS: CBC:  Recent Labs  12/29/15 0528 12/30/15 0142  WBC 10.1 8.9  HGB 12.0 12.8  HCT 36.6 37.6  MCV 93.4 92.6  PLT 201 192   INR:  Recent Labs  12/28/15 1545  INR [id number]   Basic Metabolic Panel:  Recent Labs  12/29/15 0528 12/30/15 0142  NA 136 133*  K 4.9 4.1  CL 103 98*  CO2 23 25  GLUCOSE 125* 94  BUN 13 17  CREATININE 1.24* 1.36*  CALCIUM 9.2 9.1   Liver  Function Tests:  Recent Labs  12/28/15 1545  AST 36  ALT 13*  ALKPHOS 59  BILITOT 0.6  PROT 6.8  ALBUMIN 3.7   Cardiac Enzymes: Recent Labs  12/28/15 1545  12/28/15 2329 12/29/15 0528 12/30/15 0151  CKTOTAL 190  --   --   --   --   CKMB 19.8*  --   --   --   --   TROPONINI 0.87*  < > >65.00* >65.00* >65.00*  < > = values in this interval not displayed.  BNP:  B NATRIURETIC PEPTIDE  Date/Time Value Ref Range Status  12/28/2015 05:52 PM 283.3* 0.0 - 100.0 pg/mL Final   Fasting Lipid Panel:  Recent Labs  12/29/15 0528  CHOL 257*  HDL 52  LDLCALC 178*  TRIG 134  CHOLHDL 4.9   Thyroid Function Tests:  Recent Labs  12/28/15 1752  TSH 4.671*    TELE:  SR, PVC and 4 bt run NSVT      ECG: 05/15 SR, ST elevation slowly improving  CATH: 05/13  Mid RCA lesion, 100% stenosed.  Ost LAD to Prox LAD lesion, 100% stenosed. Post intervention, there is a 0% residual stenosis. The lesion  was previously treated with a stent (unknown type). - pt says never had a stent before  There is severe left ventricular systolic dysfunction. 1. Acute total occlusion of the proximal LAD in the setting of acute anterior MI, treated successfully with primary PCI of an extensively diseased LAD, 2.5 x 32 mm Synergy DES in the mid vessel and 3.0 x 28 mm Synergy DES is placed in overlapping fashion so that it covers the remainder of the LAD back to the left main. 2. Chronic total occlusion of the right coronary artery of left right collaterals 3. Patent left circumflex 4. Severe segmental LV dysfunction with elevated LVEDP; EF 20-25% w/ 2+ MR Recommendations: The patient has had a large anterior infarct. She will be transferred to the CCU. Angiomax will be continued until the current bag runs out. She has been loaded with Plavix 600 mg. Her prognosis is tenuous and this was discussed at length with her daughter.   Radiology/Studies: Dg Chest Port 1 View 12/29/2015  CLINICAL DATA:  CHF  EXAM: PORTABLE CHEST 1 VIEW COMPARISON:  None. FINDINGS: Cardiac enlargement. Pulmonary vascularity normal. Negative for edema Mild bibasilar atelectasis/ infiltrate. Small left pleural effusion. IMPRESSION: Negative for heart failure Bibasilar atelectasis/ infiltrate and small left effusion. Possible basilar pneumonia bilaterally. Electronically Signed   By: Franchot Gallo M.D.   On: 12/29/2015 14:07     Current Medications:  . antiseptic oral rinse  7 mL Mouth Rinse BID  . aspirin EC  81 mg Oral Daily  . atorvastatin  80 mg Oral q1800  . carvedilol  3.125 mg Oral BID WC  . clopidogrel  75 mg Oral Q breakfast  . enoxaparin (LOVENOX) injection  30 mg Subcutaneous Q24H  . furosemide  40 mg Intravenous BID  . lisinopril  10 mg Oral QHS  . sodium chloride flush  3 mL Intravenous Q12H  . sodium chloride flush  3 mL Intravenous Q12H      ASSESSMENT AND PLAN: 1. CAD: Anterior STEMI. LHC with chronic RCA occlusion with L=> R collaterals and new total occlusion prox LAD. Patient had DES x 2 to pLAD. No further CP, ECG shows gradual recovery of ST elevation.  - ASA, BB, Plavix, statin. BP borderline, unable to uptitrate meds - Cardiac rehab.   2. Acute systolic CHF: EF 0000000 by LV-gram. LVEDP 22 at cath, she had dyspnea with movement and volume overload on exam  05/14  - got 2 doses of Lasix 40 mg IV 05/14. - volume status much improved, will change Lasix to PO - Continue lisinopril, low dose Coreg.  - Will add spironolactone daily when Cr stabilize, currently trending up  - Will get full echo.  3. HTN - BP well-controlled on current rx  4. Hyperlipidemia - on high-dose statin  5. Possible PNA - recheck 2v CXR - no fever, WBC OK - add ABX if needed   Plan: increase activity, ?tx stepdown  Principal Problem:   Acute ST elevation myocardial infarction (STEMI) of anterolateral wall ([facility]) Active Problems:   ST elevation myocardial infarction (STEMI) of anterior wall  ([facility])   Hyperlipidemia LDL goal <70   Essential hypertension   Signed, Barrett, Suanne Marker , PA-C 7:38 AM 12/30/2015  I have personally seen and examined this patient with Rosaria Ferries, PA-C. I agree with the assessment and plan as outlined above. My exam shows RRR, no loud murmurs. Lungs with bibasilar crackles. No LE edema. Labs reviewed. Cath films reviewed by me. She is stable. Will continue current  cardiac meds. Echo today. Transfer to telemetry unit. Will repeat chest x-ray today. She does not clinically appear to have a pneumonia but she has basilar crackles. This may be CHF.  Will continue IV Lasix today.   Lauree Chandler 12/30/2015 9:30 AM

## 2015-12-30 NOTE — Progress Notes (Signed)
Pt woke up confused with place and and time-reoriented and bed alarm activated for pt safety.

## 2015-12-30 NOTE — Care Management Important Message (Signed)
Important Message  Patient Details  Name: Sophia Snyder MRN: [id number] Date of Birth: August 14, 1919   Medicare Important Message Given:  Yes    Sharin Mons, RN 12/30/2015, 2:19 PM

## 2015-12-30 NOTE — Progress Notes (Signed)
CARDIAC REHAB PHASE I   PRE:  Rate/Rhythm: 91 SR  BP:  Sitting: 112/69        SaO2: 92 RA  MODE:  Ambulation: 240 ft   POST:  Rate/Rhythm: 112 ST  BP:  Sitting: 126/78         SaO2: 96 RA  Pt ambulated 240 ft on RA, handheld assist, steady gait, tolerated well.  Pt c/o mild DOE, denies cp, dizziness, declined rest stop. Completed MI/stent/CHF education with pt and daughters at bedside.  Reviewed risk factors, MI book, anti-platelet therapy, stent card, activity restrictions, ntg, exercise, CHF booklet and zone tool, daily weights, sodium restrictions, heart healthy diet, and phase 2 cardiac rehab. Pt verbalized understanding, receptive to education. Pt agrees to phase 2 cardiac rehab referral, will send to [facility] per pt request. Pt to recliner after walk, call bell within reach. Will follow.  [id number] Lenna Sciara, RN, BSN 12/30/2015 11:26 AM

## 2015-12-31 ENCOUNTER — Inpatient Hospital Stay (HOSPITAL_COMMUNITY): Payer: Medicare Other

## 2015-12-31 DIAGNOSIS — I255 Ischemic cardiomyopathy: Secondary | ICD-10-CM

## 2015-12-31 LAB — ECHOCARDIOGRAM LIMITED
HEIGHTINCHES: 61 in
WEIGHTICAEL: 2148.8 [oz_av]

## 2015-12-31 LAB — BASIC METABOLIC PANEL
ANION GAP: 14 (ref 5–15)
BUN: 32 mg/dL — ABNORMAL HIGH (ref 6–20)
CALCIUM: 9.3 mg/dL (ref 8.9–10.3)
CO2: 23 mmol/L (ref 22–32)
CREATININE: 1.7 mg/dL — AB (ref 0.44–1.00)
Chloride: 98 mmol/L — ABNORMAL LOW (ref 101–111)
GFR, EST AFRICAN AMERICAN: 29 mL/min — AB (ref >60)
GFR, EST NON AFRICAN AMERICAN: 25 mL/min — AB (ref >60)
GLUCOSE: 110 mg/dL — AB (ref 65–99)
Potassium: 3.8 mmol/L (ref 3.5–5.1)
Sodium: 135 mmol/L (ref 135–145)

## 2015-12-31 LAB — CBC
HCT: 36.4 % (ref 36.0–46.0)
HEMOGLOBIN: 12 g/dL (ref 12.0–15.0)
MCH: 30.5 pg (ref 26.0–34.0)
MCHC: 33 g/dL (ref 30.0–36.0)
MCV: 92.4 fL (ref 78.0–100.0)
PLATELETS: 170 10*3/uL (ref 150–400)
RBC: 3.94 MIL/uL (ref 3.87–5.11)
RDW: 13.9 % (ref 11.5–15.5)
WBC: 7.9 10*3/uL (ref 4.0–10.5)

## 2015-12-31 MED ORDER — FUROSEMIDE 40 MG PO TABS: 40.0000 mg | ORAL_TABLET | Freq: Every day | ORAL | Status: DC

## 2015-12-31 MED ORDER — LISINOPRIL 5 MG PO TABS
5.0000 mg | ORAL_TABLET | Freq: Every day | ORAL | Status: DC
  Administered 2015-12-31: 5 mg via ORAL
  Filled 2015-12-31: qty 1

## 2015-12-31 MED ORDER — PERFLUTREN LIPID MICROSPHERE
INTRAVENOUS | Status: AC
  Filled 2015-12-31: qty 10

## 2015-12-31 MED ORDER — PERFLUTREN LIPID MICROSPHERE
1.0000 mL | INTRAVENOUS | Status: AC | PRN
  Administered 2015-12-31: 3 mL via INTRAVENOUS
  Filled 2015-12-31: qty 10

## 2015-12-31 NOTE — Progress Notes (Signed)
CARDIAC REHAB PHASE I   PRE:  Rate/Rhythm: 93 SR  BP:  Sitting: 118/69        SaO2: 94 RA  MODE:  Ambulation: 275 ft   POST:  Rate/Rhythm: 115 ST  BP:  Sitting: 119/72         SaO2: 96 RA  Pt ambulated 275 ft on RA, hand held assist, mostly steady gait, tolerated well. Pt c/o mild DOE, also states she "feels stiff" due to her arthritis, which prevented her from increasing her distance today. Pt and daughters states they have no questions regarding yesterday's education at this time. Encouraged ambulation with family as tolerated. Pt verbalized understanding. Pt to recliner after walk, feet elevated, call bell within reach. Will follow if pt does not discharge today.     [id number] Lenna Sciara, RN, BSN 12/31/2015 11:01 AM

## 2015-12-31 NOTE — Progress Notes (Signed)
  Echocardiogram 2D Echocardiogram has been performed.  Donata Clay 12/31/2015, 1:20 PM

## 2015-12-31 NOTE — Progress Notes (Signed)
Patient Profile: 82 yo female w/ hx HTN, tx from [facility] as code STEMI, cath w/ PCI LAD, med rx for 100% RCA  Subjective: No complaints. She denies CP or dyspnea. Ambulating w/o difficulty. Eager to go home.   Objective: Vital signs in last 24 hours: Temp:  [97.6 F (36.4 C)-98.1 F (36.7 C)] 97.8 F (36.6 C) (05/16 0500) Pulse Rate:  [88-98] 88 (05/16 0500) Resp:  [17-28] 17 (05/15 1041) BP: (87-131)/(51-78) 123/76 mmHg (05/16 0800) SpO2:  [91 %-98 %] 96 % (05/16 0500) Weight:  [134 lb 4.8 oz (60.918 kg)] 134 lb 4.8 oz (60.918 kg) (05/16 0500) Last BM Date: 12/29/15  Intake/Output from previous day: 05/15 0701 - 05/16 0700 In: 360 [P.O.:360] Out: 400 [Urine:400] Intake/Output this shift: Total I/O In: 240 [P.O.:240] Out: 150 [Urine:150]  Medications Current Facility-Administered Medications  Medication Dose Route Frequency Provider Last Rate Last Dose  . 0.9 %  sodium chloride infusion  250 mL Intravenous PRN Sherren Mocha, MD      . 0.9 %  sodium chloride infusion  250 mL Intravenous PRN Sherren Mocha, MD      . acetaminophen (TYLENOL) tablet 650 mg  650 mg Oral Q4H PRN Sherren Mocha, MD   650 mg at 12/28/15 1918  . antiseptic oral rinse (CPC / CETYLPYRIDINIUM CHLORIDE 0.05%) solution 7 mL  7 mL Mouth Rinse BID Sherren Mocha, MD   7 mL at 12/31/15 1000  . aspirin EC tablet 81 mg  81 mg Oral Daily Sherren Mocha, MD   81 mg at 12/31/15 1000  . atorvastatin (LIPITOR) tablet 80 mg  80 mg Oral q1800 Sherren Mocha, MD   80 mg at 12/30/15 1743  . carvedilol (COREG) tablet 3.125 mg  3.125 mg Oral BID WC Larey Dresser, MD   3.125 mg at 12/31/15 0825  . clopidogrel (PLAVIX) tablet 75 mg  75 mg Oral Q breakfast Sherren Mocha, MD   75 mg at 12/31/15 0825  . enoxaparin (LOVENOX) injection 30 mg  30 mg Subcutaneous Q24H Sherren Mocha, MD   30 mg at 12/31/15 0825  . furosemide (LASIX) injection 40 mg  40 mg Intravenous BID Burnell Blanks, MD   40 mg at 12/30/15  1746  . lisinopril (PRINIVIL,ZESTRIL) tablet 10 mg  10 mg Oral QHS Sueanne Margarita, MD   10 mg at 12/30/15 2241  . morphine 2 MG/ML injection 1-2 mg  1-2 mg Intravenous Q1H PRN Sherren Mocha, MD      . nitroGLYCERIN (NITROSTAT) SL tablet 0.4 mg  0.4 mg Sublingual Q5 Min x 3 PRN Sherren Mocha, MD      . ondansetron [facility]) injection 4 mg  4 mg Intravenous Q6H PRN Sherren Mocha, MD   4 mg at 12/29/15 0553  . oxyCODONE-acetaminophen (PERCOCET/ROXICET) 5-325 MG per tablet 1-2 tablet  1-2 tablet Oral Q4H PRN Sherren Mocha, MD      . sodium chloride flush (NS) 0.9 % injection 3 mL  3 mL Intravenous Q12H Sherren Mocha, MD   3 mL at 12/31/15 1000  . sodium chloride flush (NS) 0.9 % injection 3 mL  3 mL Intravenous PRN Sherren Mocha, MD      . sodium chloride flush (NS) 0.9 % injection 3 mL  3 mL Intravenous Q12H Sherren Mocha, MD   3 mL at 12/31/15 1000  . sodium chloride flush (NS) 0.9 % injection 3 mL  3 mL Intravenous PRN Sherren Mocha, MD        PE:  General appearance: alert, cooperative and no distress Neck: no carotid bruit and no JVD Lungs: faint bibasilar crackles Heart: regular rate and rhythm, S1, S2 normal, no murmur, click, rub or gallop Extremities: no LEE Pulses: 2+ and symmetric Skin: warm and dry Neurologic: Grossly normal  Lab Results:   Recent Labs  12/29/15 0528 12/30/15 0142 12/31/15 0540  WBC 10.1 8.9 7.9  HGB 12.0 12.8 12.0  HCT 36.6 37.6 36.4  PLT 201 192 170   BMET  Recent Labs  12/29/15 0528 12/30/15 0142 12/31/15 0540  NA 136 133* 135  K 4.9 4.1 3.8  CL 103 98* 98*  CO2 23 25 23   GLUCOSE 125* 94 110*  BUN 13 17 32*  CREATININE 1.24* 1.36* 1.70*  CALCIUM 9.2 9.1 9.3   PT/INR  Recent Labs  12/28/15 1545  LABPROT 15.0  INR 1.16   Cholesterol  Recent Labs  12/29/15 0528  CHOL 257*   Cardiac Panel (last 3 results)  Recent Labs  12/28/15 1545  12/28/15 2329 12/29/15 0528 12/30/15 0151  CKTOTAL 190  --   --   --   --   CKMB  19.8*  --   --   --   --   TROPONINI 0.87*  < > >65.00* >65.00* >65.00*  RELINDX 10.4*  --   --   --   --   < > = values in this interval not displayed.  Studies/Results:  2D Echo 12/30/15 Study Conclusions  - Left ventricle: The cavity size was normal. There was moderate  concentric hypertrophy. Systolic function was moderately to  severely reduced. The estimated ejection fraction was in the  range of 30% to 35%. Doppler parameters are consistent with  restrictive physiology, indicative of decreased left ventricular  diastolic compliance and/or increased left atrial pressure.  Doppler parameters are consistent with elevated ventricular  end-diastolic filling pressure. - Aortic valve: Trileaflet; normal thickness leaflets. There was no  regurgitation. - Aortic root: The aortic root was normal in size. - Mitral valve: Structurally normal valve. There was trivial  regurgitation. - Right ventricle: Systolic function was normal. - Tricuspid valve: There was mild regurgitation. - Pulmonary arteries: Systolic pressure was mildly increased. PA  peak pressure: 35 mm Hg (S). - Pericardium, extracardiac: There was no pericardial effusion.  Impressions:  - There is akinesis of the entire anterior and anteroseptal walls  and dyskinesis of the apical septum and true apex. Thsi is  consistent with an infarct in the LAD territory. An apical  thrombus can&'t be excluded, additional images with Definity are  recommended.  F/U CXR 12/30/15 FINDINGS: Lungs are hyperexpanded. Hyperexpansion is consistent with emphysema. Interstitial markings are diffusely coarsened with chronic features. The lungs are clear wiithout focal pneumonia, edema, or pneumothorax. Tiny bilateral pleural effusions are evident. The cardiopericardial silhouette is within normal limits for size. The visualized bony structures of the thorax are intact. Telemetry leads overlie the  chest.  IMPRESSION: Emphysema with tiny bilateral pleural effusions.  Assessment/Plan  Principal Problem:   Acute ST elevation myocardial infarction (STEMI) of anterolateral wall (HCC) Active Problems:   ST elevation myocardial infarction (STEMI) of anterior wall (HCC)   Hyperlipidemia LDL goal <70   Essential hypertension   1. CAD: Anterior STEMI. LHC with chronic RCA occlusion with L=> R collaterals and new total occlusion prox LAD. Patient had DES x 2 to pLAD. No further CP, ECG shows gradual recovery of ST elevation.  - ASA, BB, Plavix, statin. BP borderline, unable to uptitrate  meds - Cardiac rehab.  - EF by echo is 30-35%. No ventricular arrhthymias on tele ? LifeVest. MD to determine.  2. Acute systolic CHF: EF 0000000 by LV-gram. LVEDP 22 at cath - 2D echo 5/15 showed EF to be 30-35% - Continue lisinopril and low dose Coreg.  - Will add spironolactone daily when Cr stabilize, currently trending up. Consider adding as an OP if renal function normalizes. - Volume appears stable. No dyspnea. Hold AM dose of Lasix for now given increase in SCr.   3. HTN - BP well-controlled on current rx  4. Hyperlipidemia - poorly controlled. LDL 181 mg/dL. Goal is <70 mg/dL.  - on high-dose statin, Lipitor 80 mg nightly - recheck FLP and HFTs in 6 weeks  5. Abnormal Lung Exam - Repeat CXR yesterday showed no PNA. Findings are c/w emphysema and tiny bilateral pleural effusions.   6. AKI: Scr continues to trend upward. Admission SCr was 1.13, now at 1.70. She is currently being treated with IV lasix, 40 mg BID for acute systolic HF and lisinopril 10 mg. Hold off on plans to start spironolactone for low EF for now. If worsening renal function, will need to replace ACE-I with hydralazine + nitrate for afterload reduction.      LOS: 3 days    Brittainy M. Ladoris Gene 12/31/2015 8:51 AM  I have personally seen and examined this patient with Lyda Jester, PA-C. I agree  with the assessment and plan as outlined above. She is s/p anterior STEMI. DES x 2 LAD. Moderate LV systolic dysfunction. LVEF=30-35% by echo. Cannot exclude apical thrombus. Will arrange limited echo with Definity today. She is on Lisinopril and Coreg. Will lower Lisinopril to 5 mg daily with worsened renal function. She had basilar crackles on exam yesterday but this is now resolved. Chest x-ray with small bilateral pleural effusions yesterday, treated with IV Lasix. Will change to po lasix today. Continue DAPT. Will not consider Lifevest given advanced age. She would not be a candidate for an ICD given advanced age. I have discussed this with the patient and daughters.   Likely d/c home tomorrow.   Lauree Chandler 12/31/2015 11:25 AM

## 2015-12-31 NOTE — Progress Notes (Signed)
Report received in patient's room via Shanell/Dawn RN in patient's room using SBAR format, reviewed orders,labs, VS, meds and patient's general condition, assumed care of patient.

## 2016-01-01 DIAGNOSIS — I5020 Unspecified systolic (congestive) heart failure: Secondary | ICD-10-CM

## 2016-01-01 DIAGNOSIS — I5022 Chronic systolic (congestive) heart failure: Secondary | ICD-10-CM

## 2016-01-01 HISTORY — DX: Unspecified systolic (congestive) heart failure: I50.20

## 2016-01-01 LAB — BASIC METABOLIC PANEL
Anion gap: 9 (ref 5–15)
BUN: 32 mg/dL — AB (ref 6–20)
CALCIUM: 9.1 mg/dL (ref 8.9–10.3)
CO2: 28 mmol/L (ref 22–32)
CREATININE: 1.59 mg/dL — AB (ref 0.44–1.00)
Chloride: 97 mmol/L — ABNORMAL LOW (ref 101–111)
GFR calc non Af Amer: 27 mL/min — ABNORMAL LOW (ref >60)
GFR, EST AFRICAN AMERICAN: 31 mL/min — AB (ref >60)
GLUCOSE: 111 mg/dL — AB (ref 65–99)
Potassium: 4.1 mmol/L (ref 3.5–5.1)
Sodium: 134 mmol/L — ABNORMAL LOW (ref 135–145)

## 2016-01-01 MED ORDER — FUROSEMIDE 40 MG PO TABS: 40.0000 mg | ORAL_TABLET | Freq: Every day | ORAL | Status: DC

## 2016-01-01 MED ORDER — CLOPIDOGREL BISULFATE 75 MG PO TABS: 75.0000 mg | ORAL_TABLET | Freq: Every day | ORAL | Status: DC

## 2016-01-01 MED ORDER — LISINOPRIL 5 MG PO TABS: 5.0000 mg | ORAL_TABLET | Freq: Every day | ORAL | Status: DC

## 2016-01-01 MED ORDER — CARVEDILOL 3.125 MG PO TABS: 3.1250 mg | ORAL_TABLET | Freq: Two times a day (BID) | ORAL | Status: DC

## 2016-01-01 MED ORDER — NITROGLYCERIN 0.4 MG SL SUBL: 0.4000 mg | SUBLINGUAL_TABLET | SUBLINGUAL | Status: DC | PRN

## 2016-01-01 MED ORDER — ATORVASTATIN CALCIUM 80 MG PO TABS: 80.0000 mg | ORAL_TABLET | Freq: Every day | ORAL | Status: DC

## 2016-01-01 NOTE — Progress Notes (Signed)
CARDIAC REHAB PHASE I   PRE:  Rate/Rhythm: 95 SR  BP:  Sitting: 117/72        SaO2: 96 RA  MODE:  Ambulation: 230 ft   POST:  Rate/Rhythm: 115 ST  BP:  Sitting: 118/76         SaO2: 96 RA  Pt up in recliner this morning, states she hopes to go home today. Pt ambulated 230 ft on RA, hand held assist, mostly steady gait, tolerated well. Pt denies any complaints other than chronic back pain related to her arthritis, which limits her ability to ambulate long distances. VSS. Pt states she has no questions regarding education at this time, phase 2 cardiac rehab referral sent to [facility]. Pt to recliner after walk, feet elevated, chair alarm on, call bell within reach.  [id number] Lenna Sciara, RN, BSN 01/01/2016 9:06 AM

## 2016-01-01 NOTE — Discharge Summary (Signed)
Discharge Summary    Patient ID: Sophia Snyder,  MRN: [id number], DOB/AGE: 05/13/1919 82 y.o.  Admit date: 12/28/2015 Discharge date: 01/01/2016  Primary Care Provider: [place], [place]  Primary Cardiologist: Dr. Burt Knack  Discharge Diagnoses    Principal Problem:   Acute ST elevation myocardial infarction (STEMI) of anterolateral wall [facility]) Active Problems:   ST elevation myocardial infarction (STEMI) of anterior wall ([place])   Hyperlipidemia LDL goal <70   Essential hypertension   Chronic systolic heart failure ([place])- EF 20-25% post STEMI   Allergies No Known Allergies  Diagnostic Studies/Procedures    Emergent LHC 12/28/15 Procedures    Coronary Stent Intervention   Left Heart Cath and Coronary Angiography    Conclusion     Mid RCA lesion, 100% stenosed.  Ost LAD to Prox LAD lesion, 100% stenosed. Post intervention, there is a 0% residual stenosis. The lesion was previously treated with a stent (unknown type).  There is severe left ventricular systolic dysfunction.  1. Acute total occlusion of the proximal LAD in the setting of acute anterior MI, treated successfully with primary PCI of an extensively diseased LAD  2. Chronic total occlusion of the right coronary artery of left right collaterals  3. Patent left circumflex  4. Severe segmental LV dysfunction with elevated LVEDP     2D Echo 12/30/15 Study Conclusions  - Left ventricle: The cavity size was normal. There was moderate  concentric hypertrophy. Systolic function was moderately to  severely reduced. The estimated ejection fraction was in the  range of 30% to 35%. Doppler parameters are consistent with  restrictive physiology, indicative of decreased left ventricular  diastolic compliance and/or increased left atrial pressure.  Doppler parameters are consistent with elevated ventricular  end-diastolic filling pressure. - Aortic valve: Trileaflet; normal thickness  leaflets. There was no  regurgitation. - Aortic root: The aortic root was normal in size. - Mitral valve: Structurally normal valve. There was trivial  regurgitation. - Right ventricle: Systolic function was normal. - Tricuspid valve: There was mild regurgitation. - Pulmonary arteries: Systolic pressure was mildly increased. PA  peak pressure: 35 mm Hg (S). - Pericardium, extracardiac: There was no pericardial effusion.  Impressions:  - There is akinesis of the entire anterior and anteroseptal walls  and dyskinesis of the apical septum and true apex. Thsi is  consistent with an infarct in the LAD territory. An apical  thrombus can&'t be excluded, additional images with Definity are  recommended.  Limited Echo with Definity Contrast 12/31/15 Study Conclusions  - Left ventricle: The cavity size was normal. Systolic function was  moderately to severely reduced. The estimated ejection fraction  was in the range of 30% to 35%. Diffuse hypokinesis with akinesis  of the apex, anterior wall, anterosepum and inferoseptum.  Impressions:  - No LV thrombus.    History of Present Illness     82 y.o. female w/ PMHx significant for HTN who presented to [facility] on 12/28/2015 with complaints of chest pain. She is brought in by [facility] as a Code STEMI.  The patient began developing substernal chest discomfort 2 days ago. She initially thought she had a cold. Symptoms were intermittent. This morning she developed severe substernal discomfort associated with diaphoresis and she called EMS. Upon their evaluation, her EKG showed ST segment changes consistent with an acute anterior STEMI. A code STEMI is called and she is brought directly to the cardiac catheterization lab. I evaluated the patient on arrival to  the hospital. She continues to complain of 5/10 chest pain. She lives independently with her grandson. 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 She has  been in generally good health. She takes medication for hypertension and she takes a daily aspirin.  The patient denies dyspnea, orthopnea, PND, lightheadedness, or syncope. Troponin peaked at >65.   Hospital Course    Patient was taken [facility] cath lab for emergent LHC on arrival. Procedure was performed by Dr. Burt Knack. She was found to have total occlusion of the proximal LAD successfully treated with primary PCI utilizing a DES. She also has chronic total occlusion of the RCA with left to right collaterals. The circumflex was widely patent. Severe segmental LV dysfunction with elevated LVEDP was also noted. EF was estimated at 20-25%. She tolerated the procedure well and left the cath lab in stable condition. She had no recurrent CP. She was placed on DAPT with ASA + Plavix, along with a BB, high intensity statin (LDL of 178 mg/DL) and an ACE-I. Her renal function did not allow for addition of Aldactone.  Her BB was changed from Bystolic to [place] given her LV dysfunction. 2D echo was obtained which confirmed low EF of 20-25%. Definity contrast was used and there was no visualization of an LV thrombus. Volume overload was addressed with IV lasix for diuresis. This resulted in AKI with increase in SCr from 1.13 day of admit to 1.7. Her medications were adjusted. Lisinopril was reduced from 10 to 5 mg and she was transitioned from IV to PO lasix. Renal function improved. SCr day of discharge was 1.59. She was adequately diuresed. She ambulated with cardiac rehab w/o difficulty. There were no post cath complications. Vital signs remained stable. She denied any recurrent CP. No dyspnea. Given her advanced age, she is not a candidate for an ICD, thus LifeVest was not ordered. On hospital day 4, she was assessed by Dr. Angelena Form who determined she was stable for discharge home. She will f/u in clinic in 1-2 weeks with Dr. Burt Knack or APP.    Consultants none   Discharge Vitals Blood pressure 119/61, pulse 105,  temperature 97.8 F (36.6 C), temperature source Oral, resp. rate 17, height 5\' 1"  (1.549 m), weight 135 lb 6.4 oz (61.417 kg), SpO2 98 %.  Filed Weights   12/30/15 0600 12/31/15 0500 01/01/16 0405  Weight: 132 lb 7.9 oz (60.1 kg) 134 lb 4.8 oz (60.918 kg) 135 lb 6.4 oz (61.417 kg)    Labs & Radiologic Studies    CBC  Recent Labs  12/30/15 0142 12/31/15 0540  WBC 8.9 7.9  HGB 12.8 12.0  HCT 37.6 36.4  MCV 92.6 92.4  PLT 192 [id number]   Basic Metabolic Panel  Recent Labs  12/31/15 0540 01/01/16 0355  NA 135 134*  K 3.8 4.1  CL 98* 97*  CO2 23 28  GLUCOSE 110* 111*  BUN 32* 32*  CREATININE 1.70* 1.59*  CALCIUM 9.3 9.1   Liver Function Tests No results for input(s): AST, ALT, ALKPHOS, BILITOT, PROT, ALBUMIN in the last 72 hours. No results for input(s): LIPASE, AMYLASE in the last 72 hours. Cardiac Enzymes  Recent Labs  12/30/15 0151  TROPONINI >65.00*   BNP Invalid input(s): POCBNP D-Dimer No results for input(s): DDIMER in the last 72 hours. Hemoglobin A1C No results for input(s): HGBA1C in the last 72 hours. Fasting Lipid Panel No results for input(s): CHOL, HDL, LDLCALC, TRIG, CHOLHDL, LDLDIRECT in the last 72 hours.  Thyroid Function Tests No results for input(s): TSH, T4TOTAL, T3FREE, THYROIDAB in the last 72 hours.  Invalid input(s): FREET3 _____________  Dg Chest 2 View  12/30/2015  CLINICAL DATA:  Shortness of breath and congestion. EXAM: CHEST  2 VIEW COMPARISON:  12/29/2015 FINDINGS: Lungs are hyperexpanded. Hyperexpansion is consistent with emphysema. Interstitial markings are diffusely coarsened with chronic features. The lungs are clear wiithout focal pneumonia, edema, or pneumothorax. Tiny bilateral pleural effusions are evident. The cardiopericardial silhouette is within normal limits for size. The visualized bony structures of the thorax are intact. Telemetry leads overlie the chest. IMPRESSION: Emphysema with tiny bilateral pleural effusions.  Electronically Signed   By: Misty Stanley M.D.   On: 12/30/2015 09:59   Dg Chest Port 1 View  12/29/2015  CLINICAL DATA:  CHF EXAM: PORTABLE CHEST 1 VIEW COMPARISON:  None. FINDINGS: Cardiac enlargement. Pulmonary vascularity normal. Negative for edema Mild bibasilar atelectasis/ infiltrate. Small left pleural effusion. IMPRESSION: Negative for heart failure Bibasilar atelectasis/ infiltrate and small left effusion. Possible basilar pneumonia bilaterally. Electronically Signed   By: Franchot Gallo M.D.   On: 12/29/2015 14:07   Disposition   Pt is being discharged home today in good condition.  Follow-up Plans & Appointments    Follow-up Information    Follow up with Sherren Mocha, MD On 01/08/2016.   Specialty:  Cardiology   Why:  at 0930 with Ellen Henri PA   Contact information:   [id number] [place] [address] [phone]      Discharge Instructions    Amb Referral to Cardiac Rehabilitation    Complete by:  As directed   To [place]  Diagnosis:   STEMI Coronary Stents       Diet - low sodium heart healthy    Complete by:  As directed      Increase activity slowly    Complete by:  As directed            Discharge Medications   Current Discharge Medication List    START taking these medications   Details  atorvastatin (LIPITOR) 80 MG tablet Take 1 tablet (80 mg total) by mouth daily at 6 PM. Qty: 30 tablet, Refills: 5    carvedilol (COREG) 3.125 MG tablet Take 1 tablet (3.125 mg total) by mouth 2 (two) times daily with a meal. Qty: 60 tablet, Refills: 5    clopidogrel (PLAVIX) 75 MG tablet Take 1 tablet (75 mg total) by mouth daily with breakfast. Qty: 30 tablet, Refills: 10    furosemide (LASIX) 40 MG tablet Take 1 tablet (40 mg total) by mouth daily. Qty: 30 tablet, Refills: 5    nitroGLYCERIN (NITROSTAT) 0.4 MG SL tablet Place 1 tablet (0.4 mg total) under the tongue every 5 (five) minutes x 3 doses as needed for chest  pain. Qty: 25 tablet, Refills: 2      CONTINUE these medications which have CHANGED   Details  lisinopril (PRINIVIL,ZESTRIL) 5 MG tablet Take 1 tablet (5 mg total) by mouth daily. Qty: 30 tablet, Refills: 5      CONTINUE these medications which have NOT CHANGED   Details  acetaminophen (TYLENOL ARTHRITIS PAIN) 650 MG CR tablet Take 1,300 mg by mouth daily as needed for pain.    aspirin EC 81 MG tablet Take 81 mg by mouth daily.    DM-APAP-CPM (CORICIDIN HBP FLU PO) Take 2 tablets by mouth 2 (two) times daily as needed (pain).    loratadine (CLARITIN) 10 MG tablet  Take 10 mg by mouth daily as needed for allergies.    Multiple Vitamin (MULTIVITAMIN WITH MINERALS) TABS tablet Take 1 tablet by mouth daily at 3 pm.    OVER THE COUNTER MEDICATION Place 1 drop into both eyes daily as needed (allergies/ itching). Over the counter allergy eye drop    trolamine salicylate (ASPERCREME) 10 % cream Apply 1 application topically daily as needed for muscle pain (shoulder/knee pain).      STOP taking these medications     Aspirin-Caffeine (BC FAST PAIN RELIEF ARTHRITIS PO)      nebivolol (BYSTOLIC) 5 MG tablet          Aspirin prescribed at discharge?  Yes High Intensity Statin Prescribed? (Lipitor 40-80mg  or Crestor 20-40mg ): Yes Beta Blocker Prescribed? Yes For EF <40%, was ACEI/ARB Prescribed? Yes ADP Receptor Inhibitor Prescribed? (i.e. Plavix etc.-Includes Medically Managed Patients): Yes For EF <40%, Aldosterone Inhibitor Prescribed? No: abnormal renal function  Was EF assessed during THIS hospitalization? Yes Was Cardiac Rehab II ordered? (Included Medically managed Patients): Yes   Outstanding Labs/Studies   F/u BMP at time of post hospital f/u F/u FLP + HFTs in 6 weeks  Duration of Discharge Encounter   Greater than 30 minutes including physician time.  Signed, Lyda Jester PA-C 01/01/2016, 11:10 AM

## 2016-01-01 NOTE — Progress Notes (Signed)
Discharge teaching and instructions reviewed. No further questions at this time. Belongings at bedside. Discharging home via daughter.

## 2016-01-01 NOTE — Progress Notes (Signed)
Patient Profile: 82 yo female w/ hx HTN, tx from [facility] as code STEMI, cath w/ PCI LAD, med rx for 100% RCA  Subjective: Doing well. No complaints. She denies CP or dyspnea. Ambulating w/o difficulty. Eager to go home.   Objective: Vital signs in last 24 hours: Temp:  [97.6 F (36.4 C)-98 F (36.7 C)] 97.8 F (36.6 C) (05/17 0405) Pulse Rate:  [86-105] 105 (05/17 0405) Resp:  [17] 17 (05/17 0405) BP: (100-123)/(61-79) 119/61 mmHg (05/17 0405) SpO2:  [97 %-98 %] 98 % (05/17 0405) Weight:  [135 lb 6.4 oz (61.417 kg)] 135 lb 6.4 oz (61.417 kg) (05/17 0405) Last BM Date: 12/29/15  Intake/Output from previous day: 05/16 0701 - 05/17 0700 In: 960 [P.O.:960] Out: 250 [Urine:250] Intake/Output this shift:    Medications Current Facility-Administered Medications  Medication Dose Route Frequency Provider Last Rate Last Dose  . 0.9 %  sodium chloride infusion  250 mL Intravenous PRN Sherren Mocha, MD      . 0.9 %  sodium chloride infusion  250 mL Intravenous PRN Sherren Mocha, MD      . acetaminophen (TYLENOL) tablet 650 mg  650 mg Oral Q4H PRN Sherren Mocha, MD   650 mg at 12/28/15 1918  . antiseptic oral rinse (CPC / CETYLPYRIDINIUM CHLORIDE 0.05%) solution 7 mL  7 mL Mouth Rinse BID Sherren Mocha, MD   7 mL at 12/31/15 2200  . aspirin EC tablet 81 mg  81 mg Oral Daily Sherren Mocha, MD   81 mg at 12/31/15 1000  . atorvastatin (LIPITOR) tablet 80 mg  80 mg Oral q1800 Sherren Mocha, MD   80 mg at 12/31/15 1643  . carvedilol (COREG) tablet 3.125 mg  3.125 mg Oral BID WC Larey Dresser, MD   3.125 mg at 12/31/15 1643  . clopidogrel (PLAVIX) tablet 75 mg  75 mg Oral Q breakfast Sherren Mocha, MD   75 mg at 12/31/15 0825  . enoxaparin (LOVENOX) injection 30 mg  30 mg Subcutaneous Q24H Sherren Mocha, MD   30 mg at 12/31/15 0825  . furosemide (LASIX) tablet 40 mg  40 mg Oral Daily Burnell Blanks, MD      . lisinopril (PRINIVIL,ZESTRIL) tablet 5 mg  5 mg Oral QHS  Burnell Blanks, MD   5 mg at 12/31/15 2038  . morphine 2 MG/ML injection 1-2 mg  1-2 mg Intravenous Q1H PRN Sherren Mocha, MD      . nitroGLYCERIN (NITROSTAT) SL tablet 0.4 mg  0.4 mg Sublingual Q5 Min x 3 PRN Sherren Mocha, MD      . ondansetron [facility]) injection 4 mg  4 mg Intravenous Q6H PRN Sherren Mocha, MD   4 mg at 12/29/15 0553  . oxyCODONE-acetaminophen (PERCOCET/ROXICET) 5-325 MG per tablet 1-2 tablet  1-2 tablet Oral Q4H PRN Sherren Mocha, MD      . sodium chloride flush (NS) 0.9 % injection 3 mL  3 mL Intravenous Q12H Sherren Mocha, MD   3 mL at 12/31/15 2200  . sodium chloride flush (NS) 0.9 % injection 3 mL  3 mL Intravenous PRN Sherren Mocha, MD      . sodium chloride flush (NS) 0.9 % injection 3 mL  3 mL Intravenous Q12H Sherren Mocha, MD   3 mL at 12/31/15 1000  . sodium chloride flush (NS) 0.9 % injection 3 mL  3 mL Intravenous PRN Sherren Mocha, MD        PE: General appearance: alert, cooperative and no distress  Neck: no carotid bruit and no JVD Lungs: faint bibasilar crackles Heart: regular rate and rhythm, S1, S2 normal, no murmur, click, rub or gallop Extremities: no LEE Pulses: 2+ and symmetric Skin: warm and dry Neurologic: Grossly normal  Lab Results:   Recent Labs  12/30/15 0142 12/31/15 0540  WBC 8.9 7.9  HGB 12.8 12.0  HCT 37.6 36.4  PLT 192 170   BMET  Recent Labs  12/30/15 0142 12/31/15 0540 01/01/16 0355  NA 133* 135 134*  K 4.1 3.8 4.1  CL 98* 98* 97*  CO2 25 23 28   GLUCOSE 94 110* 111*  BUN 17 32* 32*  CREATININE 1.36* 1.70* 1.59*  CALCIUM 9.1 9.3 9.1   PT/INR No results for input(s): LABPROT, INR in the last 72 hours. Cholesterol No results for input(s): CHOL in the last 72 hours. Cardiac Panel (last 3 results)  Recent Labs  12/30/15 0151  TROPONINI >65.00*    Studies/Results:  2D Echo 12/30/15 Study Conclusions  - Left ventricle: The cavity size was normal. There was moderate  concentric  hypertrophy. Systolic function was moderately to  severely reduced. The estimated ejection fraction was in the  range of 30% to 35%. Doppler parameters are consistent with  restrictive physiology, indicative of decreased left ventricular  diastolic compliance and/or increased left atrial pressure.  Doppler parameters are consistent with elevated ventricular  end-diastolic filling pressure. - Aortic valve: Trileaflet; normal thickness leaflets. There was no  regurgitation. - Aortic root: The aortic root was normal in size. - Mitral valve: Structurally normal valve. There was trivial  regurgitation. - Right ventricle: Systolic function was normal. - Tricuspid valve: There was mild regurgitation. - Pulmonary arteries: Systolic pressure was mildly increased. PA  peak pressure: 35 mm Hg (S). - Pericardium, extracardiac: There was no pericardial effusion.  Impressions:  - There is akinesis of the entire anterior and anteroseptal walls  and dyskinesis of the apical septum and true apex. Thsi is  consistent with an infarct in the LAD territory. An apical  thrombus can&'t be excluded, additional images with Definity are  recommended.  F/U CXR 12/30/15 FINDINGS: Lungs are hyperexpanded. Hyperexpansion is consistent with emphysema. Interstitial markings are diffusely coarsened with chronic features. The lungs are clear wiithout focal pneumonia, edema, or pneumothorax. Tiny bilateral pleural effusions are evident. The cardiopericardial silhouette is within normal limits for size. The visualized bony structures of the thorax are intact. Telemetry leads overlie the chest.  IMPRESSION: Emphysema with tiny bilateral pleural effusions.   2D Echo with Contrast 12/31/15 Study Conclusions  - Left ventricle: The cavity size was normal. Systolic function was  moderately to severely reduced. The estimated ejection fraction  was in the range of 30% to 35%. Diffuse hypokinesis  with akinesis  of the apex, anterior wall, anterosepum and inferoseptum.  Impressions:  - No LV thrombus  Assessment/Plan  Principal Problem:   Acute ST elevation myocardial infarction (STEMI) of anterolateral wall (HCC) Active Problems:   ST elevation myocardial infarction (STEMI) of anterior wall (HCC)   Hyperlipidemia LDL goal <70   Essential hypertension   1. CAD: S/p Anterior STEMI. LHC with chronic RCA occlusion with L=> R collaterals and new total occlusion prox LAD. Patient had DES x 2 to pLAD. No further CP, ECG shows gradual recovery of ST elevation.  - ASA, BB, Plavix, statin. BP borderline, unable to uptitrate meds - Cardiac rehab.  - EF by echo is 30-35%. No ventricular arrhthymias on tele. 2D echo with contrast negative for LV  thrombus. Not a candidate for LifeVest nor ICD given advanced age.  2. Acute systolic CHF: EF 0000000 by LV-gram. LVEDP 22 at cath - 2D echo 5/15 showed EF to be 30-35% - Continue lisinopril and low dose Coreg.  - Consider adding spironolactone as an OP if renal function normalizes. - Volume appears stable. No dyspnea. Continue PO Lasix. Daily weights and low sodium diet instructions on discharge.   3. HTN - BP well-controlled on current rx, despite reduction of lisinopril to 5 mg.   4. Hyperlipidemia - poorly controlled. LDL 181 mg/dL. Goal is <70 mg/dL.  - on high-dose statin, Lipitor 80 mg nightly - recheck FLP and HFTs in 6 weeks  5. Abnormal Lung Exam - Repeat CXR 5/15 showed no PNA. Findings are c/w emphysema and tiny bilateral pleural effusions.   6. AKI: Renal function improved over the last 24 hrs with medication adjustment. Her lisinopril was decreased to 5 mg and Lasix changed from IV to PO. SCr improved from 1.70 to 1.59.  Hold off on plans to start spironolactone for low EF for now. Can reassess renal function with f/u BMET at time of post hospital f/u. If worsening renal function, will need to replace ACE-I with  hydralazine + nitrate for afterload reduction.      LOS: 4 days    Brittainy M. Ladoris Gene 01/01/2016 7:13 AM   I have personally seen and examined this patient with Lyda Jester, PA-C. I agree with the assessment and plan as outlined above. Stable post STEMI. On good medical therapy and tolerating. No thrombus in LV by echo yesterday. LVEF is still 30-35%. Not an ICD candidate given advanced age. Labs reviewed. Exam with thin elderly lady, RRR, no loud murmurs, clear lungs, no LE edema. D/C home today. Follow up Dr. Burt Knack or office APP in 1-2 weeks.   Lauree Chandler 01/01/2016 10:09 AM

## 2016-01-01 NOTE — Care Management Note (Signed)
Case Management Note  Patient Details  Name: ATTISON GIRON MRN: [id number] Date of Birth: [birth year]  Subjective/Objective: Late Entry @ 1222-  Pt admitted for [facility]. Plan to d/c home today. Ambulated well with cardiac rehab.                  Action/Plan: Insurance company called in ref to pt in regards to hospital f/u with Heart Failure Team. Pt will have a cardiology appointment within one week of d/c. Pt was provided the Heart Failure Booklet  Understood the information and pt has a scale for daily weights. No further needs from CM at this time.   Expected Discharge Date:  12/31/15               Expected Discharge Plan:  Home/Self Care  In-House Referral:  NA  Discharge planning Services  CM Consult  Post Acute Care Choice:  NA Choice offered to:  NA  DME Arranged:  N/A DME Agency:  NA  HH Arranged:  NA HH Agency:  NA  Status of Service:  Completed, signed off  Medicare Important Message Given:  Yes Date Medicare IM Given:    Medicare IM give by:    Date Additional Medicare IM Given:    Additional Medicare Important Message give by:     If discussed at [place] of Stay Meetings, dates discussed:    Additional Comments:  Bethena Roys, RN 01/01/2016, 12:21 PM

## 2016-01-01 NOTE — Discharge Instructions (Signed)

## 2016-01-08 ENCOUNTER — Encounter: Payer: Self-pay | Admitting: Cardiology

## 2016-01-08 ENCOUNTER — Encounter: Payer: Self-pay | Admitting: Cardiovascular Disease

## 2016-01-14 ENCOUNTER — Ambulatory Visit (INDEPENDENT_AMBULATORY_CARE_PROVIDER_SITE_OTHER): Payer: Medicare Other | Admitting: Cardiology

## 2016-01-14 ENCOUNTER — Encounter: Payer: Self-pay | Admitting: Cardiology

## 2016-01-14 VITALS — BP 146/88 | HR 115 | Ht 61.0 in | Wt 134.8 lb

## 2016-01-14 DIAGNOSIS — I2510 Atherosclerotic heart disease of native coronary artery without angina pectoris: Secondary | ICD-10-CM | POA: Diagnosis not present

## 2016-01-14 DIAGNOSIS — E785 Hyperlipidemia, unspecified: Secondary | ICD-10-CM | POA: Diagnosis not present

## 2016-01-14 DIAGNOSIS — N183 Chronic kidney disease, stage 3 (moderate): Secondary | ICD-10-CM

## 2016-01-14 DIAGNOSIS — I10 Essential (primary) hypertension: Secondary | ICD-10-CM

## 2016-01-14 DIAGNOSIS — I2109 ST elevation (STEMI) myocardial infarction involving other coronary artery of anterior wall: Secondary | ICD-10-CM

## 2016-01-14 MED ORDER — FAMOTIDINE 20 MG PO TABS: 20.0000 mg | ORAL_TABLET | Freq: Every day | ORAL | Status: DC

## 2016-01-14 MED ORDER — NEBIVOLOL HCL 5 MG PO TABS: 5.0000 mg | ORAL_TABLET | Freq: Two times a day (BID) | ORAL | Status: DC

## 2016-01-14 NOTE — Progress Notes (Addendum)
Cardiology Office Note   Date:  01/14/2016   ID:  Sophia Snyder, DOB 03-01-1919, MRN [id number]  PCP:  Cyndy Freeze, MD  Cardiologist:  Dr. Burt Knack    Chief Complaint  Patient presents with  . Hospitalization Follow-up    psot STEMI      History of Present Illness: Sophia Snyder is a 82 y.o. female who presents for post hospital for acute ant. STEMI.  At cath found to have acute total occlusion of the prox. LD treated with PCI with DES, she also had chronic total occlusion of RCA and patent LCX.  She had elevated LVEDP and EF of 20-25%.  Echo with EF 30-35% .  No LV thrombus was seen. Pt did well and d/c'd on the 17th.  She was d/c'd on lipitor 80, coreg 3.125 BID, plavix, ASA,  lasix and NTG her lisinopril was decreased to 5 mg daily,  Not an ICD candidate given advanced age.  NEEDS BMP-- (if Cr rises hold ACE and  If stable may add Spironolactone).   Today she is doing her dishwashing and her house work.  She lives alone, denies any chest pain.  No SOB.  She does complain of dizziness on coreg happens after she takes.  She has drifted into the wall of her home due to this.  No syncope. She weighs daily and her BP is stable.though slightly up today after walking in from parking lot.  Her appetite is good.   And she has decided not to attend cardiac rehab.  She did complain of indigestion with meds.     Past Medical History  Diagnosis Date  . ST elevation myocardial infarction (STEMI) of anterior wall ([place]) 12/28/2015  . Hyperlipidemia LDL goal <70 12/30/2015  . Essential hypertension 12/30/2015  . S/P angioplasty with stent 12/28/15    DES to LAD  . CAD (coronary artery disease), native coronary artery     chronic rt coroanry artery occlusion    Past Surgical History  Procedure Laterality Date  . Cardiac catheterization N/A 12/28/2015    Procedure: Left Heart Cath and Coronary Angiography;  Surgeon: Sherren Mocha, MD;  Location: [facility];  Service: Cardiovascular;   Laterality: N/A;  . Cardiac catheterization N/A 12/28/2015    Procedure: Coronary Stent Intervention;  Surgeon: Sherren Mocha, MD;  Location: [place] [facility];  Service: Cardiovascular;  Laterality: N/A;     Current Outpatient Prescriptions  Medication Sig Dispense Refill  . acetaminophen (TYLENOL ARTHRITIS PAIN) 650 MG CR tablet Take 1,300 mg by mouth daily as needed for pain.    Marland Kitchen aspirin EC 81 MG tablet Take 81 mg by mouth daily.    Marland Kitchen atorvastatin (LIPITOR) 80 MG tablet Take 1 tablet (80 mg total) by mouth daily at 6 PM. 30 tablet 5  . clopidogrel (PLAVIX) 75 MG tablet Take 1 tablet (75 mg total) by mouth daily with breakfast. 30 tablet 10  . furosemide (LASIX) 40 MG tablet Take 1 tablet (40 mg total) by mouth daily. 30 tablet 5  . lisinopril (PRINIVIL,ZESTRIL) 5 MG tablet Take 1 tablet (5 mg total) by mouth daily. 30 tablet 5  . nitroGLYCERIN (NITROSTAT) 0.4 MG SL tablet Place 1 tablet (0.4 mg total) under the tongue every 5 (five) minutes x 3 doses as needed for chest pain. 25 tablet 2  . OVER THE COUNTER MEDICATION Place 1 drop into both eyes daily as needed (allergies/ itching). Over the counter allergy eye drop    . trolamine  salicylate (ASPERCREME) 10 % cream Apply 1 application topically daily as needed for muscle pain (shoulder/knee pain).    . famotidine (PEPCID) 20 MG tablet Take 1 tablet (20 mg total) by mouth daily. 90 tablet 3  . nebivolol (BYSTOLIC) 5 MG tablet Take 1 tablet (5 mg total) by mouth 2 (two) times daily. 180 tablet 3   No current facility-administered medications for this visit.    Allergies:   Review of patient's allergies indicates no known allergies.    Social History:  The patient  reports that she has never smoked. She does not have any smokeless tobacco history on file. She reports that she does not drink alcohol or use illicit drugs.   Family History:  The patient's family history includes Heart attack (age of onset: 65) in her father; Transient  ischemic attack (age of onset: 69) in her mother.    ROS:  General:no colds or fevers, no weight changes Skin:no rashes or ulcers HEENT:no blurred vision, no congestion CV:see HPI PUL:see HPI GI:no diarrhea constipation or melena, + indigestion GU:no hematuria, no dysuria MS:no joint pain, no claudication Neuro:no syncope, + lightheadedness Endo:no diabetes, no thyroid disease  Wt Readings from Last 3 Encounters:  01/14/16 134 lb 12.8 oz (61.145 kg)  01/01/16 135 lb 6.4 oz (61.417 kg)     PHYSICAL EXAM: VS:  BP 146/88 mmHg  Pulse 115  Ht 5\' 1"  (1.549 m)  Wt 134 lb 12.8 oz (61.145 kg)  BMI 25.48 kg/m2 , BMI Body mass index is 25.48 kg/(m^2). General:Pleasant affect, NAD Skin:Warm and dry, brisk capillary refill HEENT:normocephalic, sclera clear, mucus membranes moist Neck:supple, no JVD, no bruits  Heart:S1S2 RRR without murmur, gallup, rub or click Lungs:clear without rales, rhonchi, or wheezes [id number], non tender, + BS, do not palpate liver spleen or masses Ext:no lower ext edema,  2+ radial pulses Neuro:alert and oriented, MAE, follows commands, + facial symmetry    EKG:  EKG is ordered today. The ekg ordered today demonstrates non specific ST and T wave abnormality. ST mildy elevated V1-2 but no pain, she walked in from parking lot.     Recent Labs: 12/28/2015: ALT 13*; B Natriuretic Peptide 283.3*; TSH 4.671* 12/31/2015: Hemoglobin 12.0; Platelets 170 01/01/2016: BUN 32*; Creatinine, Ser 1.59*; Potassium 4.1; Sodium 134*    Lipid Panel    Component Value Date/Time   CHOL 257* 12/29/2015 0528   TRIG 134 12/29/2015 0528   HDL 52 12/29/2015 0528   CHOLHDL 4.9 12/29/2015 0528   VLDL 27 12/29/2015 0528   LDLCALC 178* 12/29/2015 0528       Other studies Reviewed: Additional studies/ records that were reviewed today include: . Cardiac cath: Conclusion     Mid RCA lesion, 100% stenosed.  Ost LAD to Prox LAD lesion, 100% stenosed. Post intervention, there  is a 0% residual stenosis. The lesion was previously treated with a stent (unknown type).  There is severe left ventricular systolic dysfunction.  1. Acute total occlusion of the proximal LAD in the setting of acute anterior MI, treated successfully with primary PCI of an extensively diseased LAD  2. Chronic total occlusion of the right coronary artery of left right collaterals  3. Patent left circumflex  4. Severe segmental LV dysfunction with elevated LVEDP  Recommendations: The patient has had a large anterior infarct. She will be transferred to the CCU. Angiomax will be continued until the current bag runs out. She has been loaded with Plavix 600 mg. Her prognosis is tenuous and this was  discussed at length with her daughter.       ECHO  Study Conclusions  - Left ventricle: The cavity size was normal. There was moderate  concentric hypertrophy. Systolic function was moderately to  severely reduced. The estimated ejection fraction was in the  range of 30% to 35%. Doppler parameters are consistent with  restrictive physiology, indicative of decreased left ventricular  diastolic compliance and/or increased left atrial pressure.  Doppler parameters are consistent with elevated ventricular  end-diastolic filling pressure. - Aortic valve: Trileaflet; normal thickness leaflets. There was no  regurgitation. - Aortic root: The aortic root was normal in size. - Mitral valve: Structurally normal valve. There was trivial  regurgitation. - Right ventricle: Systolic function was normal. - Tricuspid valve: There was mild regurgitation. - Pulmonary arteries: Systolic pressure was mildly increased. PA  peak pressure: 35 mm Hg (S). - Pericardium, extracardiac: There was no pericardial effusion.  Impressions:  - There is akinesis of the entire anterior and anteroseptal walls  and dyskinesis of the apical septum and true apex. Thsi is  consistent with an infarct in the  LAD territory. An apical  thrombus can&'t be excluded, additional images with Definity are  recommended. With contrast no LV thrombus.   ASSESSMENT AND PLAN:  1.  S/p acute ant STEMI with DES to LAD.  12/28/15 - no further chest pain.  EKG with changes of Ant MI. Follow up in 2 weeks to recheck BP dizziness and then first available for Dr. Burt Knack.   2. CAD with chronically occluded RCA.  3. Hyperlipidemia needs recheck in 6 weeks continue statin  4. ischemic cardiomyopathy EF 30% by Echo. Changing coreg to bystolic recheck echo in 3 months.  Not a candidate for ICD so no lifevest.  5. HTN essential  6. Chronic systolic HF  Repeat echo in 3 months.   7. Indigestion  pepcid 20 mg daily added to meds.    Current medicines are reviewed with the patient today.  The patient Has no concerns regarding medicines.  The following changes have been made:  See above Labs/ tests ordered today include:see above  Disposition:   FU:  see above  Signed, Cecilie Kicks, NP  01/14/2016 4:54 PM    [facility] [address], [place], [place], [place] Phone: [phone]; Fax: [phone]

## 2016-01-14 NOTE — Patient Instructions (Signed)
Medication Instructions:  1) STOP Coreg (Carvedilol) 2) START Pepcid 20mg  once daily 3) START Bystolic 5mg  twice daily  Labwork: BMET today  Testing/Procedures: None  Follow-Up: Your physician recommends that you schedule a follow-up appointment in: 2 weeks with an PA or NP.  Your physician recommends that you schedule a follow-up appointment with Dr. Burt Knack at his next available appointment.    Any Other Special Instructions Will Be Listed Below (If Applicable).     If you need a refill on your cardiac medications before your next appointment, please call your pharmacy.

## 2016-01-15 LAB — BASIC METABOLIC PANEL
BUN: 19 mg/dL (ref 7–25)
CHLORIDE: 98 mmol/L (ref 98–110)
CO2: 27 mmol/L (ref 20–31)
Calcium: 9.4 mg/dL (ref 8.6–10.4)
Creat: 1.52 mg/dL — ABNORMAL HIGH (ref 0.60–0.88)
Glucose, Bld: 108 mg/dL — ABNORMAL HIGH (ref 65–99)
POTASSIUM: 3.9 mmol/L (ref 3.5–5.3)
Sodium: 137 mmol/L (ref 135–146)

## 2016-01-21 ENCOUNTER — Telehealth: Payer: Self-pay | Admitting: Cardiovascular Disease

## 2016-01-21 NOTE — Telephone Encounter (Signed)
error 

## 2016-01-21 NOTE — Telephone Encounter (Signed)
I spoke with the pt's pharmacy and bystolic is requiring a PA because they only cover once a day dosing.  The pt is currently taking Bystolic 5mg  take one tablet by mouth twice a day. The pharmacy will fax over information to try and obtain PA.

## 2016-01-21 NOTE — Telephone Encounter (Signed)
New message     Pt c/o medication issue:  1. Name of Medication: Bystolic  2. How are you currently taking this medication (dosage and times per day)? 5 mg po daily  3. Are you having a reaction (difficulty breathing--STAT)? no  4. What is your medication issue? Per daughter the pt is trying to get the approval for the medication.    *STAT* If patient is at the pharmacy, call can be transferred to refill team.   1. Which medications need to be refilled? (please list name of each medication and dose if known) bystolic 5 mg po daily  2. Which pharmacy/location (including street and city if local pharmacy) is medication to be sent to?[facility] in [place]  3. Do they need a 30 day or 90 day supply? 30 days   The daughter is trying to the medication called in so the insurance to approve the medication.

## 2016-01-21 NOTE — Telephone Encounter (Signed)
Left message on voicemail for Butch Penny to determine if the pt can cut bystolic in half if we change to 10mg .

## 2016-01-22 NOTE — Telephone Encounter (Signed)
Let message on voicemail for Butch Penny to contact the office. If the pt can cut bystolic in half then we can change prescription to 10mg  take as directed.

## 2016-01-27 MED ORDER — NEBIVOLOL HCL 10 MG PO TABS: 5.0000 mg | ORAL_TABLET | Freq: Two times a day (BID) | ORAL | Status: DC

## 2016-01-27 NOTE — Telephone Encounter (Signed)
Follow up       Talk to the nurse to follow up on bystolic prior authorization

## 2016-01-27 NOTE — Telephone Encounter (Signed)
I spoke with Sophia Snyder in the pharmacy and made her aware that we have attempted to reach the family to discuss the pt's medication without success. I will go ahead and send in new prescription for Bystolic 10mg  take one half tablet by mouth twice a day.  The pharmacy can assist with cutting pills and they will speak with the family about dosage change when prescription is picked up.

## 2016-01-28 ENCOUNTER — Encounter: Payer: Self-pay | Admitting: Physician Assistant

## 2016-01-28 ENCOUNTER — Ambulatory Visit (INDEPENDENT_AMBULATORY_CARE_PROVIDER_SITE_OTHER): Payer: Medicare Other | Admitting: Physician Assistant

## 2016-01-28 VITALS — BP 110/70 | HR 100 | Ht 61.0 in | Wt 133.4 lb

## 2016-01-28 DIAGNOSIS — R42 Dizziness and giddiness: Secondary | ICD-10-CM | POA: Diagnosis not present

## 2016-01-28 DIAGNOSIS — I5022 Chronic systolic (congestive) heart failure: Secondary | ICD-10-CM

## 2016-01-28 DIAGNOSIS — I10 Essential (primary) hypertension: Secondary | ICD-10-CM | POA: Diagnosis not present

## 2016-01-28 DIAGNOSIS — E785 Hyperlipidemia, unspecified: Secondary | ICD-10-CM

## 2016-01-28 DIAGNOSIS — I2510 Atherosclerotic heart disease of native coronary artery without angina pectoris: Secondary | ICD-10-CM | POA: Diagnosis not present

## 2016-01-28 HISTORY — DX: Atherosclerotic heart disease of native coronary artery without angina pectoris: I25.10

## 2016-01-28 NOTE — Patient Instructions (Signed)
Medication Instructions:  Please discontinue Carvedilol and START Bystolic 5mg  (1/2 tablet) twice daily as previously instructed.  Labwork: None ordered  Testing/Procedures: None ordered  Follow-Up: Your physician recommends that you schedule a follow-up appointment in: 4 weeks with Lucita Ferrara   Any Other Special Instructions Will Be Listed Below (If Applicable). If you are still experiencing dizziness call the office on Friday to give [place] an update      If you need a refill on your cardiac medications before your next appointment, please call your pharmacy.

## 2016-01-28 NOTE — Progress Notes (Signed)
Cardiology Office Note    Date:  01/28/2016   ID:  Sophia Snyder, DOB 12-Jul-1919, MRN [id number]  PCP:  Cyndy Freeze, MD  Cardiologist: Dr. Burt Knack   Chief complaintDizziness and dyspnea on exertion  History of Present Illness:  Sophia Snyder is a 82 y.o. female history of CAD status post acute anterior STEMI 12/28/15 treated with DES and proximal LAD and chronic occlusion of the RCA and patent circumflex. She had elevated LVEDP and EF of 20-25% at cath. Echo EF 30-35%. No LV thrombus seen. She is not felt to be an ICD candidate given advanced age. She saw Cecilie Kicks, NP 01/14/16 plan was to check labs and if creatinine was rising hold her ACE inhibitor and if stable add spironolactone. She was only complaining of indigestion with her medications and was doing her housework without difficulty. Coreg was changed bysystolic because of dizziness.  Patient comes in today and because of confusion with her medications and pharmacy she hasn't gotten bysystolic filled yet. She continued to take the Coreg and complains of dizziness and dyspnea on exertion. She is going to the pharmacy straight from the office today. She wants to start driving again and doing her regular activities but has restricted herself because of all her dizziness.   Past Medical History  Diagnosis Date  . ST elevation myocardial infarction (STEMI) of anterior wall ([place]) 12/28/2015  . Hyperlipidemia LDL goal <70 12/30/2015  . Essential hypertension 12/30/2015  . S/P angioplasty with stent 12/28/15    DES to LAD  . CAD (coronary artery disease), native coronary artery     chronic rt coroanry artery occlusion    Past Surgical History  Procedure Laterality Date  . Cardiac catheterization N/A 12/28/2015    Procedure: Left Heart Cath and Coronary Angiography;  Surgeon: Sherren Mocha, MD;  Location: [facility];  Service: Cardiovascular;  Laterality: N/A;  . Cardiac catheterization N/A 12/28/2015    Procedure: Coronary  Stent Intervention;  Surgeon: Sherren Mocha, MD;  Location: [facility];  Service: Cardiovascular;  Laterality: N/A;    Current Medications: Outpatient Prescriptions Prior to Visit  Medication Sig Dispense Refill  . acetaminophen (TYLENOL ARTHRITIS PAIN) 650 MG CR tablet Take 1,300 mg by mouth daily as needed for pain.    Marland Kitchen aspirin EC 81 MG tablet Take 81 mg by mouth daily.    Marland Kitchen atorvastatin (LIPITOR) 80 MG tablet Take 1 tablet (80 mg total) by mouth daily at 6 PM. 30 tablet 5  . clopidogrel (PLAVIX) 75 MG tablet Take 1 tablet (75 mg total) by mouth daily with breakfast. 30 tablet 10  . famotidine (PEPCID) 20 MG tablet Take 1 tablet (20 mg total) by mouth daily. 90 tablet 3  . furosemide (LASIX) 40 MG tablet Take 1 tablet (40 mg total) by mouth daily. 30 tablet 5  . lisinopril (PRINIVIL,ZESTRIL) 5 MG tablet Take 1 tablet (5 mg total) by mouth daily. 30 tablet 5  . nebivolol (BYSTOLIC) 10 MG tablet Take 0.5 tablets (5 mg total) by mouth 2 (two) times daily. 30 tablet 6  . nitroGLYCERIN (NITROSTAT) 0.4 MG SL tablet Place 1 tablet (0.4 mg total) under the tongue every 5 (five) minutes x 3 doses as needed for chest pain. 25 tablet 2  . OVER THE COUNTER MEDICATION Place 1 drop into both eyes daily as needed (allergies/ itching). Over the counter allergy eye drop    . trolamine salicylate (ASPERCREME) 10 % cream Apply 1 application topically daily as  needed for muscle pain (shoulder/knee pain).     No facility-administered medications prior to visit.     Allergies:   Review of patient's allergies indicates no known allergies.   Social History   Social History  . Marital Status: Widowed    Spouse Name: N/A  . Number of Children: N/A  . Years of Education: N/A   Social History Main Topics  . Smoking status: Never Smoker   . Smokeless tobacco: None  . Alcohol Use: No  . Drug Use: No  . Sexual Activity: No   Other Topics Concern  . None   Social History Narrative     Family  History:  The patient's    family history includes Heart attack (age of onset: 63) in her father; Transient ischemic attack (age of onset: 55) in her mother.   ROS:   Please see the history of present illness.    Review of Systems  Cardiovascular: Positive for dyspnea on exertion.  Neurological: Positive for dizziness.   All other systems reviewed and are negative.   PHYSICAL EXAM:   VS:  BP 110/70 mmHg  Pulse 100  Ht 5\' 1"  (1.549 m)  Wt 133 lb 6.4 oz (60.51 kg)  BMI 25.22 kg/m2   GEN: Well nourished, well developed, in no acute distress Neck: no JVD, carotid bruits, or masses Cardiac:  RRR; no murmurs, rubs, or gallops,no edema  Respiratory:  clear to auscultation bilaterally, normal work of breathing GI: soft, nontender, nondistended, + BS MS: no deformity or atrophy Skin: warm and dry, no rash Neuro:  Alert and Oriented x 3, Strength and sensation are intact Psych: euthymic mood, full affect  Wt Readings from Last 3 Encounters:  01/28/16 133 lb 6.4 oz (60.51 kg)  01/14/16 134 lb 12.8 oz (61.145 kg)  01/01/16 135 lb 6.4 oz (61.417 kg)      Studies/Labs Reviewed:   EKG:  EKG is not ordered today.   Recent Labs: 12/28/2015: ALT 13*; B Natriuretic Peptide 283.3*; TSH 4.671* 12/31/2015: Hemoglobin 12.0; Platelets 170 01/14/2016: BUN 19; Creat 1.52*; Potassium 3.9; Sodium 137   Lipid Panel    Component Value Date/Time   CHOL 257* 12/29/2015 0528   TRIG 134 12/29/2015 0528   HDL 52 12/29/2015 0528   CHOLHDL 4.9 12/29/2015 0528   VLDL 27 12/29/2015 0528   LDLCALC 178* 12/29/2015 0528    Additional studies/ records that were reviewed today include:   Cardiac cath: Conclusion       Mid RCA lesion, 100% stenosed.  Ost LAD to Prox LAD lesion, 100% stenosed. Post intervention, there is a 0% residual stenosis. The lesion was previously treated with a stent (unknown type).  There is severe left ventricular systolic dysfunction.   1. Acute total occlusion of the  proximal LAD in the setting of acute anterior MI, treated successfully with primary PCI of an extensively diseased LAD  2. Chronic total occlusion of the right coronary artery of left right collaterals  3. Patent left circumflex  4. Severe segmental LV dysfunction with elevated LVEDP  Recommendations: The patient has had a large anterior infarct. She will be transferred to the CCU. Angiomax will be continued until the current bag runs out. She has been loaded with Plavix 600 mg. Her prognosis is tenuous and this was discussed at length with her daughter.            ECHO  Study Conclusions   - Left ventricle: The cavity size was normal. There was moderate  concentric hypertrophy. Systolic function was moderately to   severely reduced. The estimated ejection fraction was in the   range of 30% to 35%. Doppler parameters are consistent with   restrictive physiology, indicative of decreased left ventricular   diastolic compliance and/or increased left atrial pressure.   Doppler parameters are consistent with elevated ventricular   end-diastolic filling pressure. - Aortic valve: Trileaflet; normal thickness leaflets. There was no   regurgitation. - Aortic root: The aortic root was normal in size. - Mitral valve: Structurally normal valve. There was trivial   regurgitation. - Right ventricle: Systolic function was normal. - Tricuspid valve: There was mild regurgitation. - Pulmonary arteries: Systolic pressure was mildly increased. PA   peak pressure: 35 mm Hg (S). - Pericardium, extracardiac: There was no pericardial effusion.   Impressions:   - There is akinesis of the entire anterior and anteroseptal walls   and dyskinesis of the apical septum and true apex. Thsi is   consistent with an infarct in the LAD territory. An apical   thrombus can&'t be excluded, additional images with Definity are   recommended. With contrast no LV thrombus.     ASSESSMENT:    1. Coronary artery  disease involving native coronary artery of native heart without angina pectoris   2. Dizziness   3. Essential hypertension   4. Chronic systolic heart failure ([address])- EF 20-25% post STEMI   5. Hyperlipidemia LDL goal <70      PLAN:  In order of problems listed above: CAD with recent MI stable without angina  Dizziness she feels is coming from the [place]. Changing to bystolic today. If she doesn't feel better by the end of the week she should call and make an appointment to be seen next week. If she is feeling better L see her back in one month, Dr. Burt Knack in September.  Essential hypertension controlled  Chronic systolic heart failure EF 20-25% after MI. Patient has chronic dyspnea on exertion but no evidence of heart failure and exam  Hyperlipidemia on Lipitor.     Medication Adjustments/Labs and Tests Ordered: Current medicines are reviewed at length with the patient today.  Concerns regarding medicines are outlined above.  Medication changes, Labs and Tests ordered today are listed in the Patient Instructions below. Patient Instructions  Medication Instructions:  Please discontinue Carvedilol and START Bystolic 5mg  (1/2 tablet) twice daily as previously instructed.  Labwork: None ordered  Testing/Procedures: None ordered  Follow-Up: Your physician recommends that you schedule a follow-up appointment in: 4 weeks with Lucita Ferrara   Any Other Special Instructions Will Be Listed Below (If Applicable). If you are still experiencing dizziness call the office on Friday to give [place] an update      If you need a refill on your cardiac medications before your next appointment, please call your pharmacy.       Sumner Boast, PA-C  01/28/2016 10:48 AM    [facility] [place], [place], [place]  [address] Phone: [phone]; Fax: [phone]

## 2016-03-02 ENCOUNTER — Ambulatory Visit (INDEPENDENT_AMBULATORY_CARE_PROVIDER_SITE_OTHER): Payer: Medicare Other | Admitting: Physician Assistant

## 2016-03-02 ENCOUNTER — Encounter: Payer: Self-pay | Admitting: Physician Assistant

## 2016-03-02 VITALS — BP 134/76 | HR 81 | Ht 61.0 in | Wt 132.0 lb

## 2016-03-02 DIAGNOSIS — I2510 Atherosclerotic heart disease of native coronary artery without angina pectoris: Secondary | ICD-10-CM

## 2016-03-02 DIAGNOSIS — I10 Essential (primary) hypertension: Secondary | ICD-10-CM | POA: Diagnosis not present

## 2016-03-02 DIAGNOSIS — R42 Dizziness and giddiness: Secondary | ICD-10-CM

## 2016-03-02 DIAGNOSIS — I5022 Chronic systolic (congestive) heart failure: Secondary | ICD-10-CM | POA: Diagnosis not present

## 2016-03-02 DIAGNOSIS — E785 Hyperlipidemia, unspecified: Secondary | ICD-10-CM

## 2016-03-02 NOTE — Patient Instructions (Signed)
Medication Instructions:   Your physician recommends that you continue on your current medications as directed. Please refer to the Current Medication list given to you today.   If you need a refill on your cardiac medications before your next appointment, please call your pharmacy.  Labwork:  NONE ORDER TODAY    Testing/Procedures:  NONE ORDER TODAY    Follow-Up:  ALREADY SHCEDULED   Any Other Special Instructions Will Be Listed Below (If Applicable).

## 2016-03-02 NOTE — Progress Notes (Signed)
Cardiology Office Note    Date:  03/02/2016   ID:  Sophia Snyder, DOB 07-26-1919, MRN [id number]  PCP:  Cyndy Freeze, MD  Cardiologist: Dr. Burt Knack  CC: 1 month f/u  History of Present Illness:  Sophia Snyder is a 82 y.o. female history of CAD status post acute anterior STEMI 12/28/15 treated with DES and proximal LAD and chronic occlusion of the RCA and patent circumflex. She had elevated LVEDP and EF of 20-25% at cath. Echo EF 30-35%. No LV thrombus seen. She is not felt to be an ICD candidate given advanced age. She saw Cecilie Kicks, NP 01/14/16 plan was to check labs and if creatinine was rising hold her ACE inhibitor and if stable add spironolactone. She was only complaining of indigestion with her medications and was doing her housework without difficulty. Coreg was changed bysystolic because of dizziness.  I saw her last month and she was confused over her medication and she hadn't switched to bystolic and was still having dizziness. I reviewed her medicines in detail and she switched that day.  She comes in for f/u today accompanied by her daughter. She feels so much better. No further dizziness on bystolic. She is walking to get her paper and going shopping again. She has chronic dyspnea on exertion that is unchanged.     Past Medical History  Diagnosis Date  . ST elevation myocardial infarction (STEMI) of anterior wall ([place]) 12/28/2015  . Hyperlipidemia LDL goal <70 12/30/2015  . Essential hypertension 12/30/2015  . S/P angioplasty with stent 12/28/15    DES to LAD  . CAD (coronary artery disease), native coronary artery     chronic rt coroanry artery occlusion    Past Surgical History  Procedure Laterality Date  . Cardiac catheterization N/A 12/28/2015    Procedure: Left Heart Cath and Coronary Angiography;  Surgeon: Sherren Mocha, MD;  Location: [facility];  Service: Cardiovascular;  Laterality: N/A;  . Cardiac catheterization N/A 12/28/2015    Procedure: Coronary  Stent Intervention;  Surgeon: Sherren Mocha, MD;  Location: [facility];  Service: Cardiovascular;  Laterality: N/A;    Current Medications: Outpatient Prescriptions Prior to Visit  Medication Sig Dispense Refill  . acetaminophen (TYLENOL ARTHRITIS PAIN) 650 MG CR tablet Take 1,300 mg by mouth daily as needed for pain.    Marland Kitchen aspirin EC 81 MG tablet Take 81 mg by mouth daily.    Marland Kitchen atorvastatin (LIPITOR) 80 MG tablet Take 1 tablet (80 mg total) by mouth daily at 6 PM. 30 tablet 5  . clopidogrel (PLAVIX) 75 MG tablet Take 1 tablet (75 mg total) by mouth daily with breakfast. 30 tablet 10  . famotidine (PEPCID) 20 MG tablet Take 1 tablet (20 mg total) by mouth daily. 90 tablet 3  . furosemide (LASIX) 40 MG tablet Take 1 tablet (40 mg total) by mouth daily. 30 tablet 5  . lisinopril (PRINIVIL,ZESTRIL) 5 MG tablet Take 1 tablet (5 mg total) by mouth daily. 30 tablet 5  . nebivolol (BYSTOLIC) 10 MG tablet Take 0.5 tablets (5 mg total) by mouth 2 (two) times daily. 30 tablet 6  . nitroGLYCERIN (NITROSTAT) 0.4 MG SL tablet Place 1 tablet (0.4 mg total) under the tongue every 5 (five) minutes x 3 doses as needed for chest pain. 25 tablet 2  . OVER THE COUNTER MEDICATION Place 1 drop into both eyes daily as needed (allergies/ itching). Over the counter allergy eye drop    . trolamine salicylate (  ASPERCREME) 10 % cream Apply 1 application topically daily as needed for muscle pain (shoulder/knee pain).     No facility-administered medications prior to visit.     Allergies:   Review of patient's allergies indicates no known allergies.   Social History   Social History  . Marital Status: Widowed    Spouse Name: N/A  . Number of Children: N/A  . Years of Education: N/A   Social History Main Topics  . Smoking status: Never Smoker   . Smokeless tobacco: None  . Alcohol Use: No  . Drug Use: No  . Sexual Activity: No   Other Topics Concern  . None   Social History Narrative     Family  History:  The patient's family history includes Heart attack (age of onset: 46) in her father; Transient ischemic attack (age of onset: 42) in her mother.   ROS:   Please see the history of present illness.    Review of Systems  Constitution: Negative.  HENT: Negative.   Eyes: Negative.   Cardiovascular: Positive for dyspnea on exertion.  Respiratory: Negative.   Hematologic/Lymphatic: Negative.   Musculoskeletal: Positive for back pain. Negative for joint pain.  Gastrointestinal: Negative.   Genitourinary: Negative.   Neurological: Negative.    All other systems reviewed and are negative.   PHYSICAL EXAM:   VS:  BP 134/76 mmHg  Pulse 81  Ht 5\' 1"  (1.549 m)  Wt 132 lb (59.875 kg)  BMI 24.95 kg/m2  Physical Exam  GEN: Well nourished, well developed, looks younger than stated age, in no acute distress Neck: no JVD, carotid bruits, or masses Cardiac:RRR; no murmurs, rubs, or gallops  Respiratory:  clear to auscultation bilaterally, normal work of breathing GI: soft, nontender, nondistended, + BS Ext: without cyanosis, clubbing, or edema, Good distal pulses bilaterally MS: no deformity or atrophy Skin: warm and dry, no rash Neuro:  Alert and Oriented x 3, Strength and sensation are intact Psych: euthymic mood, full affect  Wt Readings from Last 3 Encounters:  03/02/16 132 lb (59.875 kg)  01/28/16 133 lb 6.4 oz (60.51 kg)  01/14/16 134 lb 12.8 oz (61.145 kg)      Studies/Labs Reviewed:   EKG:  EKG is not ordered today.    Recent Labs: 12/28/2015: ALT 13*; B Natriuretic Peptide 283.3*; TSH 4.671* 12/31/2015: Hemoglobin 12.0; Platelets 170 01/14/2016: BUN 19; Creat 1.52*; Potassium 3.9; Sodium 137   Lipid Panel    Component Value Date/Time   CHOL 257* 12/29/2015 0528   TRIG 134 12/29/2015 0528   HDL 52 12/29/2015 0528   CHOLHDL 4.9 12/29/2015 0528   VLDL 27 12/29/2015 0528   LDLCALC 178* 12/29/2015 0528    Additional studies/ records that were reviewed today  include:   Cardiac cath: Conclusion         Mid RCA lesion, 100% stenosed.  Ost LAD to Prox LAD lesion, 100% stenosed. Post intervention, there is a 0% residual stenosis. The lesion was previously treated with a stent (unknown type).  There is severe left ventricular systolic dysfunction.   1. Acute total occlusion of the proximal LAD in the setting of acute anterior MI, treated successfully with primary PCI of an extensively diseased LAD  2. Chronic total occlusion of the right coronary artery of left right collaterals  3. Patent left circumflex  4. Severe segmental LV dysfunction with elevated LVEDP  Recommendations: The patient has had a large anterior infarct. She will be transferred to the CCU. Angiomax will be  continued until the current bag runs out. She has been loaded with Plavix 600 mg. Her prognosis is tenuous and this was discussed at length with her daughter.              ECHO  Study Conclusions   - Left ventricle: The cavity size was normal. There was moderate   concentric hypertrophy. Systolic function was moderately to   severely reduced. The estimated ejection fraction was in the   range of 30% to 35%. Doppler parameters are consistent with   restrictive physiology, indicative of decreased left ventricular   diastolic compliance and/or increased left atrial pressure.   Doppler parameters are consistent with elevated ventricular   end-diastolic filling pressure. - Aortic valve: Trileaflet; normal thickness leaflets. There was no   regurgitation. - Aortic root: The aortic root was normal in size. - Mitral valve: Structurally normal valve. There was trivial   regurgitation. - Right ventricle: Systolic function was normal. - Tricuspid valve: There was mild regurgitation. - Pulmonary arteries: Systolic pressure was mildly increased. PA   peak pressure: 35 mm Hg (S). - Pericardium, extracardiac: There was no pericardial effusion.   Impressions:   - There is  akinesis of the entire anterior and anteroseptal walls   and dyskinesis of the apical septum and true apex. Thsi is   consistent with an infarct in the LAD territory. An apical   thrombus can&'t be excluded, additional images with Definity are   recommended. With contrast no LV thrombus.      ASSESSMENT:    1. Dizziness   2. Coronary artery disease involving native coronary artery of native heart without angina pectoris   3. Essential hypertension   4. Chronic systolic heart failure ([place])- EF 20-25% post STEMI   5. Hyperlipidemia LDL goal <70      PLAN:  In order of problems listed above:  Dizziness resolved since switching to Bystolic  CAD stable without angina follow-up with Dr. Burt Knack in September as scheduled  Essential hypertension controlled  Chronic systolic heart failure EF 20-25% well compensated, continue current dose of Lasix, lisinopril and by systolic  Hyperlipidemia continue Lipitor    Medication Adjustments/Labs and Tests Ordered: Current medicines are reviewed at length with the patient today.  Concerns regarding medicines are outlined above.  Medication changes, Labs and Tests ordered today are listed in the Patient Instructions below. Patient Instructions  Medication Instructions:   Your physician recommends that you continue on your current medications as directed. Please refer to the Current Medication list given to you today.   If you need a refill on your cardiac medications before your next appointment, please call your pharmacy.  Labwork:  NONE ORDER TODAY    Testing/Procedures:  NONE ORDER TODAY    Follow-Up:  ALREADY SHCEDULED   Any Other Special Instructions Will Be Listed Below (If Applicable).  Signed, Ermalinda Barrios, PA-C  03/02/2016 1:58 PM    [facility] [place], [place], [place]  [address] Phone: [phone]; Fax: [phone]

## 2016-04-17 ENCOUNTER — Encounter: Payer: Self-pay | Admitting: Cardiovascular Disease

## 2016-05-04 ENCOUNTER — Ambulatory Visit (INDEPENDENT_AMBULATORY_CARE_PROVIDER_SITE_OTHER): Payer: Medicare Other | Admitting: Cardiovascular Disease

## 2016-05-04 VITALS — BP 122/70 | HR 72 | Ht 61.0 in | Wt 128.2 lb

## 2016-05-04 DIAGNOSIS — I10 Essential (primary) hypertension: Secondary | ICD-10-CM | POA: Diagnosis not present

## 2016-05-04 DIAGNOSIS — E785 Hyperlipidemia, unspecified: Secondary | ICD-10-CM

## 2016-05-04 DIAGNOSIS — I2510 Atherosclerotic heart disease of native coronary artery without angina pectoris: Secondary | ICD-10-CM | POA: Diagnosis not present

## 2016-05-04 LAB — HEPATIC FUNCTION PANEL
ALT: 9 U/L (ref 6–29)
AST: 26 U/L (ref 10–35)
Albumin: 4.5 g/dL (ref 3.6–5.1)
Alkaline Phosphatase: 64 U/L (ref 33–130)
BILIRUBIN DIRECT: 0.2 mg/dL (ref <=0.2)
BILIRUBIN INDIRECT: 0.4 mg/dL (ref 0.2–1.2)
Total Bilirubin: 0.6 mg/dL (ref 0.2–1.2)
Total Protein: 7.2 g/dL (ref 6.1–8.1)

## 2016-05-04 LAB — LIPID PANEL
CHOLESTEROL: 144 mg/dL (ref 125–200)
HDL: 63 mg/dL (ref >=46)
LDL Cholesterol: 53 mg/dL (ref <130)
Total CHOL/HDL Ratio: 2.3 Ratio (ref <=5.0)
Triglycerides: 139 mg/dL (ref <150)
VLDL: 28 mg/dL (ref <30)

## 2016-05-04 NOTE — Patient Instructions (Signed)
Medication Instructions:  Your physician recommends that you continue on your current medications as directed. Please refer to the Current Medication list given to you today.  Labwork: Your physician recommends that you have lab work today: LIPID and LIVER  Testing/Procedures: No new orders.   Follow-Up: Your physician recommends that you schedule a follow-up appointment in: 4 MONTHS with Richardson Dopp PA-C   Any Other Special Instructions Will Be Listed Below (If Applicable).     If you need a refill on your cardiac medications before your next appointment, please call your pharmacy.

## 2016-05-04 NOTE — Progress Notes (Signed)
Cardiology Office Note Date:  05/04/2016   ID:  Sophia Snyder, DOB 1918-11-21, MRN [id number]  PCP:  Cyndy Freeze, MD  Cardiologist:  Sherren Mocha, MD    Chief Complaint  Patient presents with  . Coronary Artery Disease     History of Present Illness: Sophia Snyder is a 82 y.o. female who presents for follow-up evaluation. CAD status post acute anterior STEMI 12/28/15 treated with DES and proximal LAD and chronic occlusion of the RCA and patent circumflex. She had elevated LVEDP and EF of 20-25% at cath. Echo EF 30-35%. No LV thrombus seen. She is not felt to be an ICD candidate given advanced age. She saw Cecilie Kicks, NP 01/14/16 plan was to check labs and if creatinine was rising hold her ACE inhibitor and if stable add spironolactone. She was only complaining of indigestion with her medications and was doing her housework without difficulty. Coreg was changed bysystolic because of dizziness.  She lives at home. Her great-grandson lives with her and takes her to the grocery store. She still is independent, cooks and takes care of her house. She admits to shortness of breath with much physical activity. No chest pain or pressure. Blood pressure has been running in a good range. Previously had dizziness but medicine has been reduced and dizziness has resolved. The patient has had no recurrent chest pain or shortness of breath.  Past Medical History:  Diagnosis Date  . CAD (coronary artery disease), native coronary artery    chronic rt coroanry artery occlusion  . Essential hypertension 12/30/2015  . Hyperlipidemia LDL goal <70 12/30/2015  . S/P angioplasty with stent 12/28/15   DES to LAD  . ST elevation myocardial infarction (STEMI) of anterior wall ([place]) 12/28/2015    Past Surgical History:  Procedure Laterality Date  . CARDIAC CATHETERIZATION N/A 12/28/2015   Procedure: Left Heart Cath and Coronary Angiography;  Surgeon: Sherren Mocha, MD;  Location: [facility];  Service:  Cardiovascular;  Laterality: N/A;  . CARDIAC CATHETERIZATION N/A 12/28/2015   Procedure: Coronary Stent Intervention;  Surgeon: Sherren Mocha, MD;  Location: [facility];  Service: Cardiovascular;  Laterality: N/A;    Current Outpatient Prescriptions  Medication Sig Dispense Refill  . acetaminophen (TYLENOL ARTHRITIS PAIN) 650 MG CR tablet Take 1,300 mg by mouth daily as needed for pain.    Marland Kitchen aspirin EC 81 MG tablet Take 81 mg by mouth daily.    Marland Kitchen atorvastatin (LIPITOR) 80 MG tablet Take 1 tablet (80 mg total) by mouth daily at 6 PM. 30 tablet 5  . clopidogrel (PLAVIX) 75 MG tablet Take 1 tablet (75 mg total) by mouth daily with breakfast. 30 tablet 10  . famotidine (PEPCID) 20 MG tablet Take 1 tablet (20 mg total) by mouth daily. 90 tablet 3  . furosemide (LASIX) 40 MG tablet Take 1 tablet (40 mg total) by mouth daily. 30 tablet 5  . lisinopril (PRINIVIL,ZESTRIL) 5 MG tablet Take 1 tablet (5 mg total) by mouth daily. 30 tablet 5  . nebivolol (BYSTOLIC) 10 MG tablet Take 0.5 tablets (5 mg total) by mouth 2 (two) times daily. 30 tablet 6  . nitroGLYCERIN (NITROSTAT) 0.4 MG SL tablet Place 1 tablet (0.4 mg total) under the tongue every 5 (five) minutes x 3 doses as needed for chest pain. 25 tablet 2  . OVER THE COUNTER MEDICATION Place 1 drop into both eyes daily as needed (allergies/ itching). Over the counter allergy eye drop    .  OVER THE COUNTER MEDICATION Place 1-2 sprays into both nostrils at bedtime. Allergy nose spray    . trolamine salicylate (ASPERCREME) 10 % cream Apply 1 application topically daily as needed for muscle pain (shoulder/knee pain).     No current facility-administered medications for this visit.     Allergies:   Review of patient's allergies indicates no known allergies.   Social History:  The patient  reports that she has never smoked. She has never used smokeless tobacco. She reports that she does not drink alcohol or use drugs.   Family History:  The  patient's family history includes Heart attack (age of onset: 98) in her father; Transient ischemic attack (age of onset: 78) in her mother.    ROS:  Please see the history of present illness.  All other systems are reviewed and negative.    PHYSICAL EXAM: VS:  BP 122/70   Pulse 72   Ht 5\' 1"  (1.549 m)   Wt 58.2 kg (128 lb 3.2 oz)   BMI 24.22 kg/m  , BMI Body mass index is 24.22 kg/m. GEN: Well nourished, well developed, delightful elderly woman in no acute distress  HEENT: normal  Neck: no JVD, no masses. No carotid bruits Cardiac: RRR without murmur or gallop                Respiratory:  clear to auscultation bilaterally, normal work of breathing GI: soft, nontender, nondistended, + BS MS: no deformity or atrophy  Ext: no pretibial edema, pedal pulses 2+= bilaterally Skin: warm and dry, no rash Neuro:  Strength and sensation are intact Psych: euthymic mood, full affect  EKG:  EKG is not ordered today.  Recent Labs: 12/28/2015: ALT 13; B Natriuretic Peptide 283.3; TSH 4.671 12/31/2015: Hemoglobin 12.0; Platelets 170 01/14/2016: BUN 19; Creat 1.52; Potassium 3.9; Sodium 137   Lipid Panel     Component Value Date/Time   CHOL 257 (H) 12/29/2015 0528   TRIG 134 12/29/2015 0528   HDL 52 12/29/2015 0528   CHOLHDL 4.9 12/29/2015 0528   VLDL 27 12/29/2015 0528   LDLCALC 178 (H) 12/29/2015 0528      Wt Readings from Last 3 Encounters:  05/04/16 58.2 kg (128 lb 3.2 oz)  03/02/16 59.9 kg (132 lb)  01/28/16 60.5 kg (133 lb 6.4 oz)     Cardiac Studies Reviewed: Cardiac cath:    Conclusion    Mid RCA lesion, 100% stenosed.  Ost LAD to Prox LAD lesion, 100% stenosed. Post intervention, there is a 0% residual stenosis. The lesion was previously treated with a stent (unknown type).  There is severe left ventricular systolic dysfunction.  1. Acute total occlusion of the proximal LAD in the setting of acute anterior MI, treated successfully with primary PCI of an  extensively diseased LAD  2. Chronic total occlusion of the right coronary artery of left right collaterals  3. Patent left circumflex  4. Severe segmental LV dysfunction with elevated LVEDP  Recommendations: The patient has had a large anterior infarct. She will be transferred to the CCU. Angiomax will be continued until the current bag runs out. She has been loaded with Plavix 600 mg. Her prognosis is tenuous and this was discussed at length with her daughter.      ECHO  Study Conclusions  - Left ventricle: The cavity size was normal. There was moderate  concentric hypertrophy. Systolic function was moderately to  severely reduced. The estimated ejection fraction was in the  range of 30% to 35%.  Doppler parameters are consistent with  restrictive physiology, indicative of decreased left ventricular  diastolic compliance and/or increased left atrial pressure.  Doppler parameters are consistent with elevated ventricular  end-diastolic filling pressure. - Aortic valve: Trileaflet; normal thickness leaflets. There was no  regurgitation. - Aortic root: The aortic root was normal in size. - Mitral valve: Structurally normal valve. There was trivial  regurgitation. - Right ventricle: Systolic function was normal. - Tricuspid valve: There was mild regurgitation. - Pulmonary arteries: Systolic pressure was mildly increased. PA  peak pressure: 35 mm Hg (S). - Pericardium, extracardiac: There was no pericardial effusion.  Impressions:  - There is akinesis of the entire anterior and anteroseptal walls  and dyskinesis of the apical septum and true apex. Thsi is  consistent with an infarct in the LAD territory. An apical  thrombus can&'t be excluded, additional images with Definity are  recommended. With contrast no LV thrombus.    ASSESSMENT AND PLAN: 1.  Coronary artery disease, native vessel, without angina: The patient is now 4 months out from a large  anterior wall STEMI. She has excellent functional status and no cardiopulmonary symptoms on her current medications. Immune her medicines without change.  2. Essential hypertension: Blood pressure is under optimal control on a combination of lisinopril and by systolic.  3. Hyperlipidemia: Tolerating atorvastatin 80 mg daily. Lipids and LFTs updated. LDL is at goal.  4. Chronic systolic heart failure, New York Heart Association functional class I: Severely reduced LV function, but no significant limitation at this time. Medicines reviewed as above and no changes recommended today.  Current medicines are reviewed with the patient today.  The patient  concerns regarding medicines.  Labs/ tests ordered today include:  No orders of the defined types were placed in this encounter.   Disposition:   FU 4 months with Richardson Dopp  Signed, Sherren Mocha, MD  05/04/2016 11:09 AM    [facility] [address], [address], [place]  [address] Phone: [phone]; Fax: [phone]

## 2016-05-07 ENCOUNTER — Encounter: Payer: Self-pay | Admitting: Cardiovascular Disease

## 2016-06-22 ENCOUNTER — Other Ambulatory Visit: Payer: Self-pay | Admitting: Cardiovascular Disease

## 2016-08-22 ENCOUNTER — Other Ambulatory Visit: Payer: Self-pay | Admitting: Cardiovascular Disease

## 2016-09-04 ENCOUNTER — Ambulatory Visit (INDEPENDENT_AMBULATORY_CARE_PROVIDER_SITE_OTHER): Payer: Medicare Other | Admitting: Physician Assistant

## 2016-09-04 ENCOUNTER — Encounter (INDEPENDENT_AMBULATORY_CARE_PROVIDER_SITE_OTHER): Payer: Self-pay

## 2016-09-04 ENCOUNTER — Encounter: Payer: Self-pay | Admitting: Physician Assistant

## 2016-09-04 VITALS — BP 120/60 | HR 78 | Ht 61.0 in | Wt 124.4 lb

## 2016-09-04 DIAGNOSIS — E785 Hyperlipidemia, unspecified: Secondary | ICD-10-CM

## 2016-09-04 DIAGNOSIS — I2510 Atherosclerotic heart disease of native coronary artery without angina pectoris: Secondary | ICD-10-CM | POA: Diagnosis not present

## 2016-09-04 DIAGNOSIS — I5022 Chronic systolic (congestive) heart failure: Secondary | ICD-10-CM | POA: Diagnosis not present

## 2016-09-04 DIAGNOSIS — I10 Essential (primary) hypertension: Secondary | ICD-10-CM | POA: Diagnosis not present

## 2016-09-04 NOTE — Patient Instructions (Addendum)
Medication Instructions:  No changes.  Continue all your current medications.  Labwork: Today - BMET  Testing/Procedures: None   Follow-Up: Your physician wants you to follow-up in: 6 month with Dr. Burt Knack.  You will receive a reminder letter in the mail two months in advance. If you don't receive a letter, please call our office to schedule the follow-up appointment.  Any Other Special Instructions Will Be Listed Below (If Applicable).  If you need a refill on your cardiac medications before your next appointment, please call your pharmacy.

## 2016-09-04 NOTE — Progress Notes (Signed)
Cardiology Office Note:    Date:  09/04/2016   ID:  Sophia Snyder, DOB 1918-10-11, MRN [id number]  PCP:  Cyndy Freeze, MD  Cardiologist:  Dr. Sherren Mocha   Electrophysiologist:  n/a  Referring MD: Cyndy Freeze, MD   Chief Complaint  Patient presents with  . Follow-up    CAD    History of Present Illness:    Sophia Snyder is a 83 y.o. female with a hx of CAD s/p anterior STEMI in 5/17 tx with DES x 2 to the proximal LAD and CTO of the RCA.  EF was 20-25 at her LHC but improved to 30-35 by echocardiogram.  She is not an ICD candidate due to advanced age.  Last seen by Dr. Sherren Mocha 9/17.  She returns for Cardiology follow up.    She is doing well.  She lives with her great grandson.  She feels like she is doing well.  She denies chest pain, syncope, orthopnea, PND, edema.  She does note dyspnea on exertion with some activities, but she feels like this is improved.  She denies any bleeding issues.  Prior CV studies that were reviewed today include:    Limited Echo 5/17 EF 30-35, diff HK and apical, anterior, anteroseptal, inferoseptal AK, No LV clot  Echo 5/17 Mod conc LVH, EF 30-35, anterior and anteroseptal AK and DK of apical septum and true apex, restrictive physio, mild TR, PASP 35  LHC 5/17 LAD ostial 100 LCx irregs RCA mid 100 with L-R collats (CTO) EF 20-25, 2+ MR PCI: 2.5 x 32 mm Synergy DES and 3 x 28 mm Synergy DES to LAD  Past Medical History:  Diagnosis Date  . CAD (coronary artery disease), native coronary artery    chronic rt coroanry artery occlusion  . Essential hypertension 12/30/2015  . Hyperlipidemia LDL goal <70 12/30/2015  . S/P angioplasty with stent 12/28/15   DES to LAD  . ST elevation myocardial infarction (STEMI) of anterior wall ([place]) 12/28/2015    Past Surgical History:  Procedure Laterality Date  . CARDIAC CATHETERIZATION N/A 12/28/2015   Procedure: Left Heart Cath and Coronary Angiography;  Surgeon: Sherren Mocha, MD;  Location:  [facility];  Service: Cardiovascular;  Laterality: N/A;  . CARDIAC CATHETERIZATION N/A 12/28/2015   Procedure: Coronary Stent Intervention;  Surgeon: Sherren Mocha, MD;  Location: [facility];  Service: Cardiovascular;  Laterality: N/A;    Current Medications: Current Meds  Medication Sig  . acetaminophen (TYLENOL ARTHRITIS PAIN) 650 MG CR tablet Take 1,300 mg by mouth daily as needed for pain.  Marland Kitchen aspirin EC 81 MG tablet Take 81 mg by mouth daily.  Marland Kitchen atorvastatin (LIPITOR) 80 MG tablet TAKE 1 TABLET EVERY EVENING AT 6 PM.  . BYSTOLIC 10 MG tablet TAKE 1/2 TABLET BY MOUTH 2 TIMES DAILY.  Marland Kitchen clopidogrel (PLAVIX) 75 MG tablet Take 1 tablet (75 mg total) by mouth daily with breakfast.  . famotidine (PEPCID) 20 MG tablet Take 1 tablet (20 mg total) by mouth daily.  . furosemide (LASIX) 40 MG tablet TAKE 1 TABLET DAILY  . lisinopril (PRINIVIL,ZESTRIL) 5 MG tablet Take 1 tablet (5 mg total) by mouth daily.  . nitroGLYCERIN (NITROSTAT) 0.4 MG SL tablet Place 1 tablet (0.4 mg total) under the tongue every 5 (five) minutes x 3 doses as needed for chest pain.  Marland Kitchen OVER THE COUNTER MEDICATION Place 1 drop into both eyes daily as needed (allergies/ itching). Over the counter allergy eye drop  .  OVER THE COUNTER MEDICATION Place 1-2 sprays into both nostrils at bedtime. Allergy nose spray  . trolamine salicylate (ASPERCREME) 10 % cream Apply 1 application topically daily as needed for muscle pain (shoulder/knee pain).     Allergies:   Patient has no known allergies.   Social History   Social History  . Marital status: Widowed    Spouse name: N/A  . Number of children: N/A  . Years of education: N/A   Social History Main Topics  . Smoking status: Never Smoker  . Smokeless tobacco: Never Used  . Alcohol use No  . Drug use: No  . Sexual activity: No   Other Topics Concern  . None   Social History Narrative  . None     Family History:  The patient's family history includes  Heart attack (age of onset: 21) in her father; Transient ischemic attack (age of onset: 22) in her mother.   ROS:   Please see the history of present illness.    Review of Systems  Cardiovascular: Positive for dyspnea on exertion.  Respiratory: Positive for cough.    All other systems reviewed and are negative.   EKGs/Labs/Other Test Reviewed:    EKG:  EKG is  ordered today.  The ekg ordered today demonstrates NSR, HR 78, normal axis, ST depression in V5-6, QTc 412 ms, no significant changes  Recent Labs: 12/28/2015: B Natriuretic Peptide 283.3; TSH 4.671 12/31/2015: Hemoglobin 12.0; Platelets 170 01/14/2016: BUN 19; Creat 1.52; Potassium 3.9; Sodium 137 05/04/2016: ALT 9   Recent Lipid Panel    Component Value Date/Time   CHOL 144 05/04/2016 1139   TRIG 139 05/04/2016 1139   HDL 63 05/04/2016 1139   CHOLHDL 2.3 05/04/2016 1139   VLDL 28 05/04/2016 1139   LDLCALC 53 05/04/2016 1139     Physical Exam:    VS:  BP 120/60   Pulse 78   Ht 5\' 1"  (1.549 m)   Wt 124 lb 6.4 oz (56.4 kg)   BMI 23.51 kg/m     Wt Readings from Last 3 Encounters:  09/04/16 124 lb 6.4 oz (56.4 kg)  05/04/16 128 lb 3.2 oz (58.2 kg)  03/02/16 132 lb (59.9 kg)     Physical Exam  Constitutional: She is oriented to person, place, and time. She appears well-developed and well-nourished. No distress.  HENT:  Head: Normocephalic and atraumatic.  Eyes: No scleral icterus.  Neck: Normal range of motion. No JVD present. Carotid bruit is not present.  Cardiovascular: Normal rate, regular rhythm, S1 normal, S2 normal and normal heart sounds.   No murmur heard. Pulmonary/Chest: Breath sounds normal. She has no wheezes. She has no rhonchi. She has no rales.  Abdominal: Soft. There is no tenderness.  Musculoskeletal: She exhibits no edema.  Neurological: She is alert and oriented to person, place, and time.  Skin: Skin is warm and dry.  Psychiatric: She has a normal mood and affect.    ASSESSMENT:     1. Coronary artery disease involving native coronary artery of native heart without angina pectoris   2. Chronic systolic heart failure ([place])- EF 20-25% post STEMI   3. Essential hypertension   4. Hyperlipidemia LDL goal <70    PLAN:    In order of problems listed above:  1. CAD - s/p anterior STEMI in 5/17 tx with DES x 2 to the LAD.  She has residual CTO of the RCA.  She is doing well without angina.  She is tolerating dual  antiplatelet Rx.  Consider continuing this longer than 12 mos if she continues to tolerate it well.  -  Continue ASA, Plavix, Lipitor 80, ACE inhibitor, beta-blocker.  2. Chronic systolic CHF - 2/2 Ischemic CM.  EF 30-35.  She is not a candidate for ICD given advanced age.  NYHA 2-2b.  Continue current dose of ACE inhibitor, beta-blocker, furosemide.  Creatinine in 5/17 was 1.5.  Will check BMET today.  3. HTN - BP well controlled on current regimen.   4. HL - Continue Lipitor 80.  Labs 05/04/2016: ALT 9; HDL 63; LDL Cholesterol 53.     Medication Adjustments/Labs and Tests Ordered: Current medicines are reviewed at length with the patient today.  Concerns regarding medicines are outlined above.  Medication changes, Labs and Tests ordered today are outlined in the Patient Instructions noted below. Patient Instructions  Medication Instructions:  No changes.  Continue all your current medications.  Labwork: Today - BMET  Testing/Procedures: None   Follow-Up: Your physician wants you to follow-up in: 6 month with Dr. Burt Knack.  You will receive a reminder letter in the mail two months in advance. If you don't receive a letter, please call our office to schedule the follow-up appointment.  Any Other Special Instructions Will Be Listed Below (If Applicable).  If you need a refill on your cardiac medications before your next appointment, please call your pharmacy.   Signed, Richardson Dopp, PA-C  09/04/2016 11:36 AM    [facility] [address], [place], [place]  [address] Phone: [phone]; Fax: [phone]

## 2016-09-05 LAB — BASIC METABOLIC PANEL
BUN/Creatinine Ratio: 17 (ref 12–28)
BUN: 32 mg/dL (ref 10–36)
CHLORIDE: 97 mmol/L (ref 96–106)
CO2: 24 mmol/L (ref 18–29)
Calcium: 9.9 mg/dL (ref 8.7–10.3)
Creatinine, Ser: 1.83 mg/dL — ABNORMAL HIGH (ref 0.57–1.00)
GFR calc Af Amer: 27 mL/min/{1.73_m2} — ABNORMAL LOW (ref >59)
GFR calc non Af Amer: 23 mL/min/{1.73_m2} — ABNORMAL LOW (ref >59)
GLUCOSE: 103 mg/dL — AB (ref 65–99)
Potassium: 4.9 mmol/L (ref 3.5–5.2)
Sodium: 139 mmol/L (ref 134–144)

## 2016-10-17 ENCOUNTER — Other Ambulatory Visit: Payer: Self-pay | Admitting: Cardiovascular Disease

## 2016-11-19 ENCOUNTER — Telehealth: Payer: Self-pay | Admitting: Cardiovascular Disease

## 2016-11-19 NOTE — Telephone Encounter (Signed)
New message   Pt daughter is calling to make sure it's ok for pt to take Celebrex. Her pcp prescribed it to her.

## 2016-11-19 NOTE — Telephone Encounter (Signed)
Will forward to pharmacy staff.

## 2016-11-19 NOTE — Telephone Encounter (Signed)
Celebrex ok with cardiac medications. NSAIDs can increase bleeding risk. Would recommend using only as needed for as short as possible.

## 2016-11-19 NOTE — Telephone Encounter (Signed)
Informed patient's daughter of pharmacy's recommendations. Patient's daughter verbalized understanding and stated that patient was only taking medication for a couple of weeks.

## 2016-12-22 ENCOUNTER — Other Ambulatory Visit: Payer: Self-pay | Admitting: Cardiovascular Disease

## 2017-02-26 ENCOUNTER — Other Ambulatory Visit: Payer: Self-pay | Admitting: Cardiology

## 2017-03-01 NOTE — Telephone Encounter (Signed)
REFILL 

## 2017-03-22 ENCOUNTER — Encounter: Payer: Self-pay | Admitting: Cardiovascular Disease

## 2017-03-22 ENCOUNTER — Ambulatory Visit (INDEPENDENT_AMBULATORY_CARE_PROVIDER_SITE_OTHER): Payer: Medicare Other | Admitting: Cardiovascular Disease

## 2017-03-22 VITALS — BP 146/80 | HR 75 | Ht 61.5 in | Wt 118.6 lb

## 2017-03-22 DIAGNOSIS — I10 Essential (primary) hypertension: Secondary | ICD-10-CM | POA: Diagnosis not present

## 2017-03-22 DIAGNOSIS — I2510 Atherosclerotic heart disease of native coronary artery without angina pectoris: Secondary | ICD-10-CM | POA: Diagnosis not present

## 2017-03-22 MED ORDER — FUROSEMIDE 40 MG PO TABS: 40.0000 mg | ORAL_TABLET | Freq: Every day | ORAL | 3 refills | Status: DC

## 2017-03-22 MED ORDER — NITROGLYCERIN 0.4 MG SL SUBL: 0.4000 mg | SUBLINGUAL_TABLET | SUBLINGUAL | 2 refills | Status: DC | PRN

## 2017-03-22 MED ORDER — CLOPIDOGREL BISULFATE 75 MG PO TABS: 75.0000 mg | ORAL_TABLET | Freq: Every day | ORAL | 3 refills | Status: DC

## 2017-03-22 MED ORDER — NEBIVOLOL HCL 10 MG PO TABS: ORAL_TABLET | ORAL | 3 refills | Status: DC

## 2017-03-22 MED ORDER — ATORVASTATIN CALCIUM 80 MG PO TABS: 80.0000 mg | ORAL_TABLET | Freq: Every day | ORAL | 3 refills | Status: DC

## 2017-03-22 MED ORDER — FAMOTIDINE 20 MG PO TABS: 20.0000 mg | ORAL_TABLET | Freq: Every day | ORAL | 3 refills | Status: DC

## 2017-03-22 NOTE — Patient Instructions (Signed)
Medication Instructions:  Your physician recommends that you continue on your current medications as directed. Please refer to the Current Medication list given to you today.  Labwork: Your physician recommends that you have lab work today: BMP  Testing/Procedures: No new orders.   Follow-Up: Your physician wants you to follow-up in: 6 MONTHS with PA/NP. You will receive a reminder letter in the mail two months in advance. If you don't receive a letter, please call our office to schedule the follow-up appointment.  Your physician wants you to follow-up in: 1 YEAR with Dr Burt Knack. You will receive a reminder letter in the mail two months in advance. If you don't receive a letter, please call our office to schedule the follow-up appointment.   Any Other Special Instructions Will Be Listed Below (If Applicable).     If you need a refill on your cardiac medications before your next appointment, please call your pharmacy.

## 2017-03-22 NOTE — Progress Notes (Signed)
Cardiology Office Note Date:  03/22/2017   ID:  Sophia Snyder, DOB [birth year], MRN [id number]  PCP:  Cyndy Freeze, MD  Cardiologist:  Sherren Mocha, MD    Chief Complaint  Patient presents with  . Follow-up     History of Present Illness: Sophia Snyder is a 83 y.o. female who presents for Follow-up of coronary artery disease after presenting with a large anterior wall MI in 2017. LV function is severely depressed with an LVEF of 30-35% by echo assessment and May 2017.  The patient is here with her daughter today. She continues to do remarkably well and live independently with the assistance of her great grandson who is living with her since he was 60 years old. He is now 85. She is able to remain active within her house and she has no symptoms with her activities of daily living. She does admit to mild shortness of breath with some activities. She denies chest pain or pressure, lightheadedness, presyncope, heart palpitations, leg swelling, orthopnea, or PND.  Past Medical History:  Diagnosis Date  . CAD (coronary artery disease), native coronary artery    chronic rt coroanry artery occlusion  . Essential hypertension 12/30/2015  . Hyperlipidemia LDL goal <70 12/30/2015  . S/P angioplasty with stent 12/28/15   DES to LAD  . ST elevation myocardial infarction (STEMI) of anterior wall ([place]) 12/28/2015    Past Surgical History:  Procedure Laterality Date  . CARDIAC CATHETERIZATION N/A 12/28/2015   Procedure: Left Heart Cath and Coronary Angiography;  Surgeon: Sherren Mocha, MD;  Location: [facility];  Service: Cardiovascular;  Laterality: N/A;  . CARDIAC CATHETERIZATION N/A 12/28/2015   Procedure: Coronary Stent Intervention;  Surgeon: Sherren Mocha, MD;  Location: [facility];  Service: Cardiovascular;  Laterality: N/A;    Current Outpatient Prescriptions  Medication Sig Dispense Refill  . acetaminophen (TYLENOL ARTHRITIS PAIN) 650 MG CR tablet Take 1,300 mg by  mouth daily as needed for pain.    Marland Kitchen aspirin EC 81 MG tablet Take 81 mg by mouth daily.    Marland Kitchen atorvastatin (LIPITOR) 80 MG tablet Take 1 tablet (80 mg total) by mouth daily at 6 PM. 90 tablet 1  . BYSTOLIC 10 MG tablet TAKE 1/2 TABLET BY MOUTH 2 TIMES DAILY. 30 tablet 7  . clopidogrel (PLAVIX) 75 MG tablet Take 75 mg by mouth daily.  10  . famotidine (PEPCID) 20 MG tablet TAKE 1 TABLET BY MOUTH DAILY 90 tablet 1  . furosemide (LASIX) 40 MG tablet Take 1 tablet (40 mg total) by mouth daily. 90 tablet 1  . lisinopril (PRINIVIL,ZESTRIL) 10 MG tablet Take 10 mg by mouth daily.    . nitroGLYCERIN (NITROSTAT) 0.4 MG SL tablet Place 1 tablet (0.4 mg total) under the tongue every 5 (five) minutes x 3 doses as needed for chest pain. 25 tablet 2  . OVER THE COUNTER MEDICATION Place 1 drop into both eyes daily as needed (allergies/ itching). Over the counter allergy eye drop    . OVER THE COUNTER MEDICATION Place 1-2 sprays into both nostrils at bedtime. Allergy nose spray    . trolamine salicylate (ASPERCREME) 10 % cream Apply 1 application topically daily as needed for muscle pain (shoulder/knee pain).     No current facility-administered medications for this visit.     Allergies:   Patient has no known allergies.   Social History:  The patient  reports that she has never smoked. She has never used smokeless  tobacco. She reports that she does not drink alcohol or use drugs.   Family History:  The patient's  family history includes Heart attack (age of onset: 6) in her father; Transient ischemic attack (age of onset: 62) in her mother.    ROS:  Please see the history of present illness.  Otherwise, review of systems is positive for Cough, depression, back pain, dizziness, balance problems.  All other systems are reviewed and negative.    PHYSICAL EXAM: VS:  BP (!) 146/80   Pulse 75   Ht 5' 1.5" (1.562 m)   Wt 118 lb 9.6 oz (53.8 kg)   BMI 22.05 kg/m  , BMI Body mass index is 22.05 kg/m. GEN:  Well nourished, well developed, pleasant elderly woman appearing younger than her age, in no acute distress  HEENT: normal  Neck: no JVD, no masses. No carotid bruits Cardiac: RRR without murmur or gallop                Respiratory:  clear to auscultation bilaterally, normal work of breathing GI: soft, nontender, nondistended, + BS MS: no deformity or atrophy  Ext: no pretibial edema, pedal pulses 2+= bilaterally Skin: warm and dry, no rash Neuro:  Strength and sensation are intact Psych: euthymic mood, full affect  EKG:  EKG is ordered today. The ekg ordered today shows normal sinus rhythm 75 bpm, ST and T-wave abnormality consider anterolateral ischemia.  Recent Labs: 05/04/2016: ALT 9 09/04/2016: BUN 32; Creatinine, Ser 1.83; Potassium 4.9; Sodium 139   Lipid Panel     Component Value Date/Time   CHOL 144 05/04/2016 1139   TRIG 139 05/04/2016 1139   HDL 63 05/04/2016 1139   CHOLHDL 2.3 05/04/2016 1139   VLDL 28 05/04/2016 1139   LDLCALC 53 05/04/2016 1139      Wt Readings from Last 3 Encounters:  03/22/17 118 lb 9.6 oz (53.8 kg)  09/04/16 124 lb 6.4 oz (56.4 kg)  05/04/16 128 lb 3.2 oz (58.2 kg)     Cardiac Studies Reviewed: Echo 12/31/2015: Study Conclusions  - Left ventricle: The cavity size was normal. Systolic function was   moderately to severely reduced. The estimated ejection fraction   was in the range of 30% to 35%. Diffuse hypokinesis with akinesis   of the apex, anterior wall, anterosepum and inferoseptum.  Impressions:  - No LV thrombus.  ASSESSMENT AND PLAN: 1.  CAD, native vessel, without angina: The patient is remarkably stable and continues to have a good functional capacity. Her medications are reviewed and include low-dose aspirin, clopidogrel, a high intensity statin drug, furosemide, lisinopril, and nebivolol. No changes are recommended today.  2. Chronic systolic heart failure with New York Heart Association functional class II symptoms and  known severe LV dysfunction related to a large anterior infarct. Will update labs today. Current medicines will be continued.  3. Chronic kidney disease stage IV: Update metabolic panel today. Continue ACE inhibitor.  4. Hyperlipidemia: Most recent lipids reviewed as above with LDL cholesterol at goal. Continue atorvastatin 80 mg daily.  Current medicines are reviewed with the patient today.  The patient does not have concerns regarding medicines.  Labs/ tests ordered today include:  No orders of the defined types were placed in this encounter.   Disposition:   FU 6 months APP, one year with me  Signed, Sherren Mocha, MD  03/22/2017 4:25 PM    [facility] [place], [place], [place]  [address] Phone: [phone]; Fax: [phone]  

## 2017-03-23 ENCOUNTER — Other Ambulatory Visit: Payer: Self-pay

## 2017-03-23 DIAGNOSIS — I2510 Atherosclerotic heart disease of native coronary artery without angina pectoris: Secondary | ICD-10-CM

## 2017-03-23 DIAGNOSIS — I10 Essential (primary) hypertension: Secondary | ICD-10-CM

## 2017-03-23 LAB — BASIC METABOLIC PANEL
BUN/Creatinine Ratio: 19 (ref 12–28)
BUN: 42 mg/dL — ABNORMAL HIGH (ref 10–36)
CALCIUM: 9.8 mg/dL (ref 8.7–10.3)
CO2: 23 mmol/L (ref 20–29)
CREATININE: 2.2 mg/dL — AB (ref 0.57–1.00)
Chloride: 98 mmol/L (ref 96–106)
GFR calc Af Amer: 21 mL/min/{1.73_m2} — ABNORMAL LOW (ref >59)
GFR, EST NON AFRICAN AMERICAN: 19 mL/min/{1.73_m2} — AB (ref >59)
Glucose: 104 mg/dL — ABNORMAL HIGH (ref 65–99)
POTASSIUM: 5.1 mmol/L (ref 3.5–5.2)
Sodium: 136 mmol/L (ref 134–144)

## 2017-04-20 ENCOUNTER — Other Ambulatory Visit: Payer: Self-pay

## 2017-04-21 ENCOUNTER — Other Ambulatory Visit: Payer: Self-pay

## 2017-04-22 ENCOUNTER — Other Ambulatory Visit: Payer: Medicare Other | Admitting: *Deleted

## 2017-04-22 DIAGNOSIS — I10 Essential (primary) hypertension: Secondary | ICD-10-CM

## 2017-04-22 DIAGNOSIS — I2510 Atherosclerotic heart disease of native coronary artery without angina pectoris: Secondary | ICD-10-CM | POA: Diagnosis not present

## 2017-04-22 LAB — BASIC METABOLIC PANEL
BUN/Creatinine Ratio: 16 (ref 12–28)
BUN: 32 mg/dL (ref 10–36)
CHLORIDE: 96 mmol/L (ref 96–106)
CO2: 23 mmol/L (ref 20–29)
Calcium: 9.6 mg/dL (ref 8.7–10.3)
Creatinine, Ser: 2.02 mg/dL — ABNORMAL HIGH (ref 0.57–1.00)
GFR, EST AFRICAN AMERICAN: 24 mL/min/{1.73_m2} — AB (ref >59)
GFR, EST NON AFRICAN AMERICAN: 21 mL/min/{1.73_m2} — AB (ref >59)
GLUCOSE: 97 mg/dL (ref 65–99)
POTASSIUM: 4.5 mmol/L (ref 3.5–5.2)
SODIUM: 135 mmol/L (ref 134–144)

## 2017-06-25 DIAGNOSIS — N183 Chronic kidney disease, stage 3 (moderate): Secondary | ICD-10-CM | POA: Diagnosis not present

## 2017-06-25 DIAGNOSIS — Z23 Encounter for immunization: Secondary | ICD-10-CM | POA: Diagnosis not present

## 2017-06-25 DIAGNOSIS — J309 Allergic rhinitis, unspecified: Secondary | ICD-10-CM | POA: Diagnosis not present

## 2017-06-25 DIAGNOSIS — Z0000 Encounter for general adult medical examination without abnormal findings: Secondary | ICD-10-CM | POA: Diagnosis not present

## 2017-06-25 DIAGNOSIS — I10 Essential (primary) hypertension: Secondary | ICD-10-CM | POA: Diagnosis not present

## 2017-06-25 DIAGNOSIS — E559 Vitamin D deficiency, unspecified: Secondary | ICD-10-CM | POA: Diagnosis not present

## 2017-06-25 DIAGNOSIS — Z79899 Other long term (current) drug therapy: Secondary | ICD-10-CM | POA: Diagnosis not present

## 2017-09-16 ENCOUNTER — Encounter: Payer: Self-pay | Admitting: Physician Assistant

## 2017-09-24 ENCOUNTER — Encounter: Payer: Self-pay | Admitting: Physician Assistant

## 2017-09-24 ENCOUNTER — Ambulatory Visit: Payer: Medicare Other | Admitting: Physician Assistant

## 2017-09-24 VITALS — BP 140/70 | HR 70 | Ht 61.5 in | Wt 119.1 lb

## 2017-09-24 DIAGNOSIS — I5022 Chronic systolic (congestive) heart failure: Secondary | ICD-10-CM

## 2017-09-24 DIAGNOSIS — R0602 Shortness of breath: Secondary | ICD-10-CM | POA: Diagnosis not present

## 2017-09-24 DIAGNOSIS — N183 Chronic kidney disease, stage 3 unspecified: Secondary | ICD-10-CM

## 2017-09-24 DIAGNOSIS — I10 Essential (primary) hypertension: Secondary | ICD-10-CM

## 2017-09-24 DIAGNOSIS — I2510 Atherosclerotic heart disease of native coronary artery without angina pectoris: Secondary | ICD-10-CM | POA: Diagnosis not present

## 2017-09-24 LAB — PRO B NATRIURETIC PEPTIDE: NT-PRO BNP: 2699 pg/mL — AB (ref 0–738)

## 2017-09-24 LAB — BASIC METABOLIC PANEL
BUN / CREAT RATIO: 16 (ref 12–28)
BUN: 38 mg/dL — ABNORMAL HIGH (ref 10–36)
CO2: 24 mmol/L (ref 20–29)
Calcium: 9.5 mg/dL (ref 8.7–10.3)
Chloride: 98 mmol/L (ref 96–106)
Creatinine, Ser: 2.45 mg/dL — ABNORMAL HIGH (ref 0.57–1.00)
GFR calc Af Amer: 19 mL/min/{1.73_m2} — ABNORMAL LOW (ref >59)
GFR calc non Af Amer: 16 mL/min/{1.73_m2} — ABNORMAL LOW (ref >59)
GLUCOSE: 106 mg/dL — AB (ref 65–99)
POTASSIUM: 4.7 mmol/L (ref 3.5–5.2)
SODIUM: 138 mmol/L (ref 134–144)

## 2017-09-24 MED ORDER — HYDRALAZINE HCL 10 MG PO TABS: 10.0000 mg | ORAL_TABLET | Freq: Three times a day (TID) | ORAL | 11 refills | Status: DC

## 2017-09-24 NOTE — Patient Instructions (Addendum)
Medication Instructions:  1. START HYDRALAZINE 10 MG 1 TABLET THREE TIMES DAILY; MAKE SURE TO SPACE OUT 8 HOURS APART  Labwork: TODAY BMET, PRO BNP  Testing/Procedures: NONE ORDERED TODAY  Follow-Up: 10/15/17 @ 12:15 WITH [facility]   Any Other Special Instructions Will Be Listed Below (If Applicable).     If you need a refill on your cardiac medications before your next appointment, please call your pharmacy.

## 2017-09-24 NOTE — Progress Notes (Signed)
Cardiology Office Note:    Date:  09/24/2017   ID:  Sophia Snyder, DOB 1919/05/16, MRN [id number]  PCP:  Cyndy Freeze, MD  Cardiologist:  Sherren Mocha, MD   Referring MD: Cyndy Freeze, MD   Chief Complaint  Patient presents with  . Follow-up    CAD, heart failure  . Shortness of Breath    History of Present Illness:    Sophia Snyder is a 84 y.o. female with a hx of systolic heart failure, chronic kidney disease and coronary artery disease status post anterior ST elevation myocardial infarction in May 2017 treated with a drug-eluting stent x2 to the proximal LAD.  RCA is known to be chronically occluded.  Ejection fraction was as low as 20-25% but improved to 30-35%.  She was last seen by Dr. Burt Knack in August 2018.  Sophia Snyder returns for Cardiology follow up.  She has noted over the last several weeks that  she is more short of breath.  She denies orthopnea, PND or significant edema.  She denies chest discomfort or syncope.  She denies weight gain.  Prior CV studies:   The following studies were reviewed today:  Limited Echo 5/17 EF 30-35, diff HK and apical, anterior, anteroseptal, inferoseptal AK, No LV clot  Echo 5/17 Mod conc LVH, EF 30-35, anterior and anteroseptal AK and DK of apical septum and true apex, restrictive physio, mild TR, PASP 35  LHC 5/17 LAD ostial 100 LCx irregs RCA mid 100 with L-R collats (CTO) EF 20-25, 2+ MR PCI: 2.5 x 32 mm Synergy DES and 3 x 28 mm Synergy DES to LAD   Past Medical History:  Diagnosis Date  . CAD (coronary artery disease), native coronary artery    chronic rt coroanry artery occlusion  . Essential hypertension 12/30/2015  . Hyperlipidemia LDL goal <70 12/30/2015  . S/P angioplasty with stent 12/28/15   DES to LAD  . ST elevation myocardial infarction (STEMI) of anterior wall ([place]) 12/28/2015    Past Surgical History:  Procedure Laterality Date  . CARDIAC CATHETERIZATION N/A 12/28/2015   Procedure: Left Heart Cath and  Coronary Angiography;  Surgeon: Sherren Mocha, MD;  Location: [facility];  Service: Cardiovascular;  Laterality: N/A;  . CARDIAC CATHETERIZATION N/A 12/28/2015   Procedure: Coronary Stent Intervention;  Surgeon: Sherren Mocha, MD;  Location: [facility];  Service: Cardiovascular;  Laterality: N/A;    Current Medications: Current Meds  Medication Sig  . acetaminophen (TYLENOL ARTHRITIS PAIN) 650 MG CR tablet Take 1,300 mg by mouth daily as needed for pain.  Marland Kitchen aspirin EC 81 MG tablet Take 81 mg by mouth daily.  Marland Kitchen atorvastatin (LIPITOR) 80 MG tablet Take 1 tablet (80 mg total) by mouth daily at 6 PM.  . clopidogrel (PLAVIX) 75 MG tablet Take 1 tablet (75 mg total) by mouth daily.  . famotidine (PEPCID) 20 MG tablet Take 1 tablet (20 mg total) by mouth daily.  . furosemide (LASIX) 40 MG tablet Take 1 tablet (40 mg total) by mouth daily.  . nebivolol (BYSTOLIC) 10 MG tablet TAKE 1/2 TABLET BY MOUTH 2 TIMES DAILY.  . nitroGLYCERIN (NITROSTAT) 0.4 MG SL tablet Place 1 tablet (0.4 mg total) under the tongue every 5 (five) minutes x 3 doses as needed for chest pain.  Marland Kitchen OVER THE COUNTER MEDICATION Place 1 drop into both eyes daily as needed (allergies/ itching). Over the counter allergy eye drop  . OVER THE COUNTER MEDICATION Place 1-2 sprays into both  nostrils at bedtime. Allergy nose spray  . traMADol (ULTRAM) 50 MG tablet Take 50 mg by mouth every 8 (eight) hours as needed for moderate pain or severe pain.   Marland Kitchen trolamine salicylate (ASPERCREME) 10 % cream Apply 1 application topically daily as needed for muscle pain (shoulder/knee pain).     Allergies:   Patient has no known allergies.   Social History   Tobacco Use  . Smoking status: Never Smoker  . Smokeless tobacco: Never Used  Substance Use Topics  . Alcohol use: No  . Drug use: No     Family Hx: The patient's family history includes Heart attack (age of onset: 44) in her father; Transient ischemic attack (age of onset:  15) in her mother.  ROS:   Please see the history of present illness.    Review of Systems  Respiratory: Positive for cough and shortness of breath.    All other systems reviewed and are negative.   EKGs/Labs/Other Test Reviewed:    EKG:  EKG is not ordered today.   Recent Labs: 04/22/2017: BUN 32; Creatinine, Ser 2.02; Potassium 4.5; Sodium 135   Recent Lipid Panel Lab Results  Component Value Date/Time   CHOL 144 05/04/2016 11:39 AM   TRIG 139 05/04/2016 11:39 AM   HDL 63 05/04/2016 11:39 AM   CHOLHDL 2.3 05/04/2016 11:39 AM   LDLCALC 53 05/04/2016 11:39 AM    Physical Exam:    VS:  BP 140/70   Pulse 70   Ht 5' 1.5" (1.562 m)   Wt 119 lb 1.9 oz (54 kg)   SpO2 96%   BMI 22.14 kg/m     Wt Readings from Last 3 Encounters:  09/24/17 119 lb 1.9 oz (54 kg)  03/22/17 118 lb 9.6 oz (53.8 kg)  09/04/16 124 lb 6.4 oz (56.4 kg)     Physical Exam  Constitutional: She is oriented to person, place, and time. She appears well-developed and well-nourished. No distress.  HENT:  Head: Normocephalic and atraumatic.  Neck: No JVD present.  Cardiovascular: Normal rate and regular rhythm.  No murmur heard. Pulmonary/Chest: Effort normal. She has no rales.  Abdominal: Soft.  Musculoskeletal: She exhibits no edema.  Neurological: She is alert and oriented to person, place, and time.  Skin: Skin is warm and dry.    ASSESSMENT & PLAN:    1.  SOB (shortness of breath)  She has been more short of breath recently.  She does not appear to be volume overloaded on exam.  She is off of her ACE inhibitor secondary to worsening creatinine.  Her blood pressure is also increased.  I will obtain a BMET, BNP today.  If her BNP is significantly elevated, I will adjust her Lasix.  Follow-up with me in 3-4 weeks.  2.  Chronic systolic heart failure (HCC) EF 30-35.  NYHA 2 b.  She does not appear to be volume overloaded on exam.  Obtain BMET, BNP today.  Adjust Lasix if necessary.  She is no  longer on ACE inhibitor.  I will start her on hydralazine 10 mg 3 times a day for afterload reduction.  Consider adding nitrates at follow-up.  3.  Stage 3 chronic kidney disease (HCC) Lisinopril was stopped at last visit secondary worsening creatinine.  Obtain follow-up BMET today.  4.  Coronary artery disease History of anterior MI in May 2017 treated with a DES x2 to the LAD.  She has a known chronically occluded RCA.  She denies anginal symptoms.  Continue aspirin, clopidogrel, statin, beta-blocker.  5.  Essential hypertension Blood pressure above target.  Add hydralazine as noted above.   Dispo:  Return in about 4 weeks (around 10/22/2017) for Close Follow Up, w/ Richardson Dopp, PA-C.   Medication Adjustments/Labs and Tests Ordered: Current medicines are reviewed at length with the patient today.  Concerns regarding medicines are outlined above.  Tests Ordered: Orders Placed This Encounter  Procedures  . Basic metabolic panel  . Pro b natriuretic peptide (BNP)   Medication Changes: Meds ordered this encounter  Medications  . hydrALAZINE (APRESOLINE) 10 MG tablet    Sig: Take 1 tablet (10 mg total) by mouth 3 (three) times daily.    Dispense:  90 tablet    Refill:  11    Order Specific Question:   Supervising Provider    Answer:   Deboraha Sprang [[id number]]    Signed, Richardson Dopp, PA-C  09/24/2017 11:27 AM    [facility] [place], [place], [place]  [address] Phone: [phone]; Fax: [phone]

## 2017-10-01 ENCOUNTER — Telehealth: Payer: Self-pay | Admitting: *Deleted

## 2017-10-01 DIAGNOSIS — I5022 Chronic systolic (congestive) heart failure: Secondary | ICD-10-CM

## 2017-10-01 DIAGNOSIS — I10 Essential (primary) hypertension: Secondary | ICD-10-CM

## 2017-10-01 NOTE — Telephone Encounter (Signed)
-----   Message from Liliane Shi, [place] sent at 10/01/2017  1:56 PM EST ----- Please see notes from 09/24/17. Patient needs a follow up BMET, BNP.  Please arrange (Monday 2/18 will be ok). Richardson Dopp, PA-C    10/01/2017 1:56 PM

## 2017-10-01 NOTE — Telephone Encounter (Signed)
Patient and daughter informed and verbalized understanding of plan. 

## 2017-10-04 ENCOUNTER — Other Ambulatory Visit: Payer: Medicare Other | Admitting: *Deleted

## 2017-10-04 DIAGNOSIS — I10 Essential (primary) hypertension: Secondary | ICD-10-CM

## 2017-10-04 DIAGNOSIS — I5022 Chronic systolic (congestive) heart failure: Secondary | ICD-10-CM | POA: Diagnosis not present

## 2017-10-05 ENCOUNTER — Telehealth: Payer: Self-pay | Admitting: *Deleted

## 2017-10-05 LAB — BASIC METABOLIC PANEL
BUN / CREAT RATIO: 15 (ref 12–28)
BUN: 34 mg/dL (ref 10–36)
CHLORIDE: 96 mmol/L (ref 96–106)
CO2: 24 mmol/L (ref 20–29)
CREATININE: 2.33 mg/dL — AB (ref 0.57–1.00)
Calcium: 9.4 mg/dL (ref 8.7–10.3)
GFR calc non Af Amer: 17 mL/min/{1.73_m2} — ABNORMAL LOW (ref >59)
GFR, EST AFRICAN AMERICAN: 20 mL/min/{1.73_m2} — AB (ref >59)
GLUCOSE: 116 mg/dL — AB (ref 65–99)
Potassium: 4.4 mmol/L (ref 3.5–5.2)
SODIUM: 137 mmol/L (ref 134–144)

## 2017-10-05 LAB — BRAIN NATRIURETIC PEPTIDE: BNP: 193.3 pg/mL — AB (ref 0.0–100.0)

## 2017-10-05 NOTE — Telephone Encounter (Signed)
Pt has been notified of lab results by phone with verbal understanding. Pt thanked me for my call and did ask if I would call her daughter Butch Penny as well and give her the results also. Sophia Snyder, pt's daughter has been notified of pt's lab results as well. Butch Penny also thanked me for my call. Both the pt and her daughter are aware there are no changes to be made with medications at this time.

## 2017-10-05 NOTE — Telephone Encounter (Signed)
-----   Message from Liliane Shi, [place] sent at 10/05/2017  1:15 PM EST ----- Renal function stable.  BNP just slightly elevated.  This would suggest that heart failure has improved. Continue current medications and follow up as planned.  Richardson Dopp, PA-C    10/05/2017 1:13 PM

## 2017-10-15 ENCOUNTER — Ambulatory Visit: Payer: Medicare Other | Admitting: Physician Assistant

## 2017-10-15 ENCOUNTER — Encounter: Payer: Self-pay | Admitting: Physician Assistant

## 2017-10-15 VITALS — BP 132/70 | HR 80 | Ht 61.5 in | Wt 118.4 lb

## 2017-10-15 DIAGNOSIS — I5022 Chronic systolic (congestive) heart failure: Secondary | ICD-10-CM | POA: Diagnosis not present

## 2017-10-15 DIAGNOSIS — N183 Chronic kidney disease, stage 3 unspecified: Secondary | ICD-10-CM

## 2017-10-15 DIAGNOSIS — I2510 Atherosclerotic heart disease of native coronary artery without angina pectoris: Secondary | ICD-10-CM

## 2017-10-15 DIAGNOSIS — I10 Essential (primary) hypertension: Secondary | ICD-10-CM | POA: Diagnosis not present

## 2017-10-15 DIAGNOSIS — G8929 Other chronic pain: Secondary | ICD-10-CM

## 2017-10-15 DIAGNOSIS — M546 Pain in thoracic spine: Secondary | ICD-10-CM

## 2017-10-15 NOTE — Progress Notes (Signed)
Cardiology Office Note:    Date:  10/15/2017   ID:  Sophia Snyder, DOB 11/25/1918, MRN [id number]  PCP:  Cyndy Freeze, MD  Cardiologist:  Sherren Mocha, MD   Referring MD: Cyndy Freeze, MD   Chief Complaint  Patient presents with  . Follow-up    CHF    History of Present Illness:    Sophia Snyder is a 84 y.o. female with systolic heart failure, chronic kidney disease and coronary artery disease status post anterior ST elevation myocardial infarction in May 2017 treated with a drug-eluting stent x 2 to the proximal LAD.  RCA is known to be chronically occluded.  Ejection fraction was as low as 20-25% but improved to 30-35%.  She was last seen 09/24/17.  I started her on Hydralazine as she was off her ACE inhibitor due to worsening creatinine.  BNP was significantly elevated and I increased her Lasix.    Sophia Snyder returns for follow up.  She feels that her breathing is better after taking extra Lasix for 1 week.  She denies any chest pain, paroxysmal nocturnal dyspnea, significant edema or syncope. She has been having significant back pain that radiates from the mid thoracic area to her lower L chest.   Prior CV studies:   The following studies were reviewed today:  Limited Echo 5/17 EF 30-35, diff HK and apical, anterior, anteroseptal, inferoseptal AK, No LV clot  Echo 5/17 Mod conc LVH, EF 30-35, anterior and anteroseptal AK and DK of apical septum and true apex, restrictive physio, mild TR, PASP 35  LHC 5/17 LAD ostial 100 LCx irregs RCA mid 100 with L-R collats (CTO) EF 20-25, 2+ MR PCI: 2.5 x 32 mm Synergy DES and 3 x 28 mm Synergy DES to LAD  Past Medical History:  Diagnosis Date  . CAD (coronary artery disease), native coronary artery    chronic rt coroanry artery occlusion  . Essential hypertension 12/30/2015  . Hyperlipidemia LDL goal <70 12/30/2015  . S/P angioplasty with stent 12/28/15   DES to LAD  . ST elevation myocardial infarction (STEMI) of anterior wall  ([place]) 12/28/2015    Past Surgical History:  Procedure Laterality Date  . CARDIAC CATHETERIZATION N/A 12/28/2015   Procedure: Left Heart Cath and Coronary Angiography;  Surgeon: Sherren Mocha, MD;  Location: [place] [facility];  Service: Cardiovascular;  Laterality: N/A;  . CARDIAC CATHETERIZATION N/A 12/28/2015   Procedure: Coronary Stent Intervention;  Surgeon: Sherren Mocha, MD;  Location: [facility];  Service: Cardiovascular;  Laterality: N/A;    Current Medications: Current Meds  Medication Sig  . acetaminophen (TYLENOL ARTHRITIS PAIN) 650 MG CR tablet Take 1,300 mg by mouth daily as needed for pain.  Marland Kitchen aspirin EC 81 MG tablet Take 81 mg by mouth daily.  Marland Kitchen atorvastatin (LIPITOR) 80 MG tablet Take 1 tablet (80 mg total) by mouth daily at 6 PM.  . clopidogrel (PLAVIX) 75 MG tablet Take 1 tablet (75 mg total) by mouth daily.  . famotidine (PEPCID) 20 MG tablet Take 1 tablet (20 mg total) by mouth daily.  . furosemide (LASIX) 40 MG tablet Take 1 tablet (40 mg total) by mouth daily.  . hydrALAZINE (APRESOLINE) 10 MG tablet Take 1 tablet (10 mg total) by mouth 3 (three) times daily.  . nebivolol (BYSTOLIC) 10 MG tablet TAKE 1/2 TABLET BY MOUTH 2 TIMES DAILY.  . nitroGLYCERIN (NITROSTAT) 0.4 MG SL tablet Place 1 tablet (0.4 mg total) under the tongue every 5 (five)  minutes x 3 doses as needed for chest pain.  Marland Kitchen OVER THE COUNTER MEDICATION Place 1 drop into both eyes daily as needed (allergies/ itching). Over the counter allergy eye drop  . OVER THE COUNTER MEDICATION Place 1-2 sprays into both nostrils at bedtime. Allergy nose spray  . traMADol (ULTRAM) 50 MG tablet Take 50 mg by mouth every 8 (eight) hours as needed for moderate pain or severe pain.   Marland Kitchen trolamine salicylate (ASPERCREME) 10 % cream Apply 1 application topically daily as needed for muscle pain (shoulder/knee pain).     Allergies:   Patient has no known allergies.   Social History   Tobacco Use  . Smoking status:  Never Smoker  . Smokeless tobacco: Never Used  Substance Use Topics  . Alcohol use: No  . Drug use: No     Family Hx: The patient's family history includes Heart attack (age of onset: 33) in her father; Transient ischemic attack (age of onset: 21) in her mother.  ROS:   Please see the history of present illness.    Review of Systems  Respiratory: Positive for shortness of breath.   Musculoskeletal: Positive for back pain.   All other systems reviewed and are negative.   EKGs/Labs/Other Test Reviewed:    EKG:  EKG is  ordered today.  The ekg ordered today demonstrates normal sinus rhythm, heart rate 78, normal axis, nonspecific ST-T wave changes, QTC 430 ms, no significant change when compared to prior tracing  Recent Labs: 09/24/2017: NT-Pro BNP 2,699 10/04/2017: BNP 193.3; BUN 34; Creatinine, Ser 2.33; Potassium 4.4; Sodium 137   Recent Lipid Panel Lab Results  Component Value Date/Time   CHOL 144 05/04/2016 11:39 AM   TRIG 139 05/04/2016 11:39 AM   HDL 63 05/04/2016 11:39 AM   CHOLHDL 2.3 05/04/2016 11:39 AM   LDLCALC 53 05/04/2016 11:39 AM    Physical Exam:    VS:  BP 132/70   Pulse 80   Ht 5' 1.5" (1.562 m)   Wt 118 lb 6.4 oz (53.7 kg)   SpO2 99%   BMI 22.01 kg/m     Wt Readings from Last 3 Encounters:  10/15/17 118 lb 6.4 oz (53.7 kg)  09/24/17 119 lb 1.9 oz (54 kg)  03/22/17 118 lb 9.6 oz (53.8 kg)     Physical Exam  Constitutional: She is oriented to person, place, and time. She appears well-developed and well-nourished. No distress.  HENT:  Head: Normocephalic and atraumatic.  Neck: No JVD present.  Cardiovascular: Normal rate and regular rhythm.  No murmur heard. Pulmonary/Chest: Effort normal. She has no rales.  Abdominal: Soft.  Musculoskeletal: She exhibits edema (trace bilat ankle edema).  Neurological: She is alert and oriented to person, place, and time.  Skin: Skin is warm and dry.    ASSESSMENT & PLAN:    #1.  Chronic systolic heart  failure (HCC) NYHA 2.  EF 30-35.  She had significant elevation in her BNP recently with increasing shortness of breath.  This is improved with increasing doses of Lasix.  She currently appears stable.  She is no longer on ACE inhibitor secondary to worsening renal function.  Continue current dose of beta-blocker, hydralazine.  #2.  Coronary artery disease Prior anterior MI in May 2017 treated with DES x2 to the LAD.  She has known chronically occluded RCA.  She is not having anginal symptoms.  She does describe some back pain.  ECG today is unchanged.  I suspect her back pain  is related to arthritis or degenerative disc disease.  Continue current dose of aspirin, Plavix, statin beta-blocker.  #3.  Essential hypertension The patient's blood pressure is controlled on her current regimen.  Continue current therapy.   #4.  Stage 3 chronic kidney disease ([place]) Recent creatinine stable.  #5.  Chronic thoracic back pain, unspecified back pain laterality She has been having significant thoracic area back pain.  Primary care has given her tramadol.  This does not seem to be helping as much as it was originally.  She has more pain with riding in a car.  As noted, her ECG is unchanged.  Her symptoms are not consistent with a cardiac etiology.  I have encouraged her to follow-up with primary care for further evaluation and management.   Dispo:  Return in about 6 months (around 04/17/2018) for Routine Follow Up, w/ Dr. Burt Knack, or Richardson Dopp, PA-C.   Medication Adjustments/Labs and Tests Ordered: Current medicines are reviewed at length with the patient today.  Concerns regarding medicines are outlined above.  Tests Ordered: Orders Placed This Encounter  Procedures  . EKG 12-Lead   Medication Changes: No orders of the defined types were placed in this encounter.   Signed, Richardson Dopp, PA-C  10/15/2017 12:59 PM    [facility] [place], [place], [place]  [address] Phone:  [phone]; Fax: [phone]

## 2017-10-15 NOTE — Patient Instructions (Signed)
Medication Instructions:  1. Your physician recommends that you continue on your current medications as directed. Please refer to the Current Medication list given to you today.   Labwork: NONE ORDERED TODAY  Testing/Procedures: NONE ORDERED TODAY  Follow-Up: Your physician wants you to follow-up in: 6 MONTHS WITH DR. COOPER  You will receive a reminder letter in the mail two months in advance. If you don't receive a letter, please call our office to schedule the follow-up appointment.   Any Other Special Instructions Will Be Listed Below (If Applicable).     If you need a refill on your cardiac medications before your next appointment, please call your pharmacy.   

## 2017-10-18 DIAGNOSIS — I10 Essential (primary) hypertension: Secondary | ICD-10-CM | POA: Diagnosis not present

## 2017-10-18 DIAGNOSIS — M5134 Other intervertebral disc degeneration, thoracic region: Secondary | ICD-10-CM | POA: Diagnosis not present

## 2017-10-18 DIAGNOSIS — Z6823 Body mass index (BMI) 23.0-23.9, adult: Secondary | ICD-10-CM | POA: Diagnosis not present

## 2017-10-18 DIAGNOSIS — N183 Chronic kidney disease, stage 3 (moderate): Secondary | ICD-10-CM | POA: Diagnosis not present

## 2017-11-22 DIAGNOSIS — I10 Essential (primary) hypertension: Secondary | ICD-10-CM | POA: Diagnosis not present

## 2017-11-22 DIAGNOSIS — M5432 Sciatica, left side: Secondary | ICD-10-CM | POA: Diagnosis not present

## 2017-11-22 DIAGNOSIS — Z6823 Body mass index (BMI) 23.0-23.9, adult: Secondary | ICD-10-CM | POA: Diagnosis not present

## 2017-12-15 ENCOUNTER — Other Ambulatory Visit: Payer: Self-pay | Admitting: Cardiology

## 2017-12-21 DIAGNOSIS — M7918 Myalgia, other site: Secondary | ICD-10-CM | POA: Diagnosis not present

## 2017-12-21 DIAGNOSIS — Z6821 Body mass index (BMI) 21.0-21.9, adult: Secondary | ICD-10-CM | POA: Diagnosis not present

## 2017-12-21 DIAGNOSIS — M8008XA Age-related osteoporosis with current pathological fracture, vertebra(e), initial encounter for fracture: Secondary | ICD-10-CM | POA: Diagnosis not present

## 2017-12-21 DIAGNOSIS — M549 Dorsalgia, unspecified: Secondary | ICD-10-CM | POA: Diagnosis not present

## 2017-12-24 DIAGNOSIS — M4804 Spinal stenosis, thoracic region: Secondary | ICD-10-CM | POA: Diagnosis not present

## 2017-12-24 DIAGNOSIS — M8008XA Age-related osteoporosis with current pathological fracture, vertebra(e), initial encounter for fracture: Secondary | ICD-10-CM | POA: Diagnosis not present

## 2017-12-28 DIAGNOSIS — M7918 Myalgia, other site: Secondary | ICD-10-CM | POA: Diagnosis not present

## 2017-12-28 DIAGNOSIS — S233XXA Sprain of ligaments of thoracic spine, initial encounter: Secondary | ICD-10-CM | POA: Diagnosis not present

## 2017-12-28 DIAGNOSIS — Z6821 Body mass index (BMI) 21.0-21.9, adult: Secondary | ICD-10-CM | POA: Diagnosis not present

## 2018-01-09 IMAGING — CR DG CHEST 1V PORT
1 series · 1 of 1 positions shown · non-contrast
Comparison: None.

CLINICAL DATA: CHF

EXAM:
PORTABLE CHEST 1 VIEW

[AP]
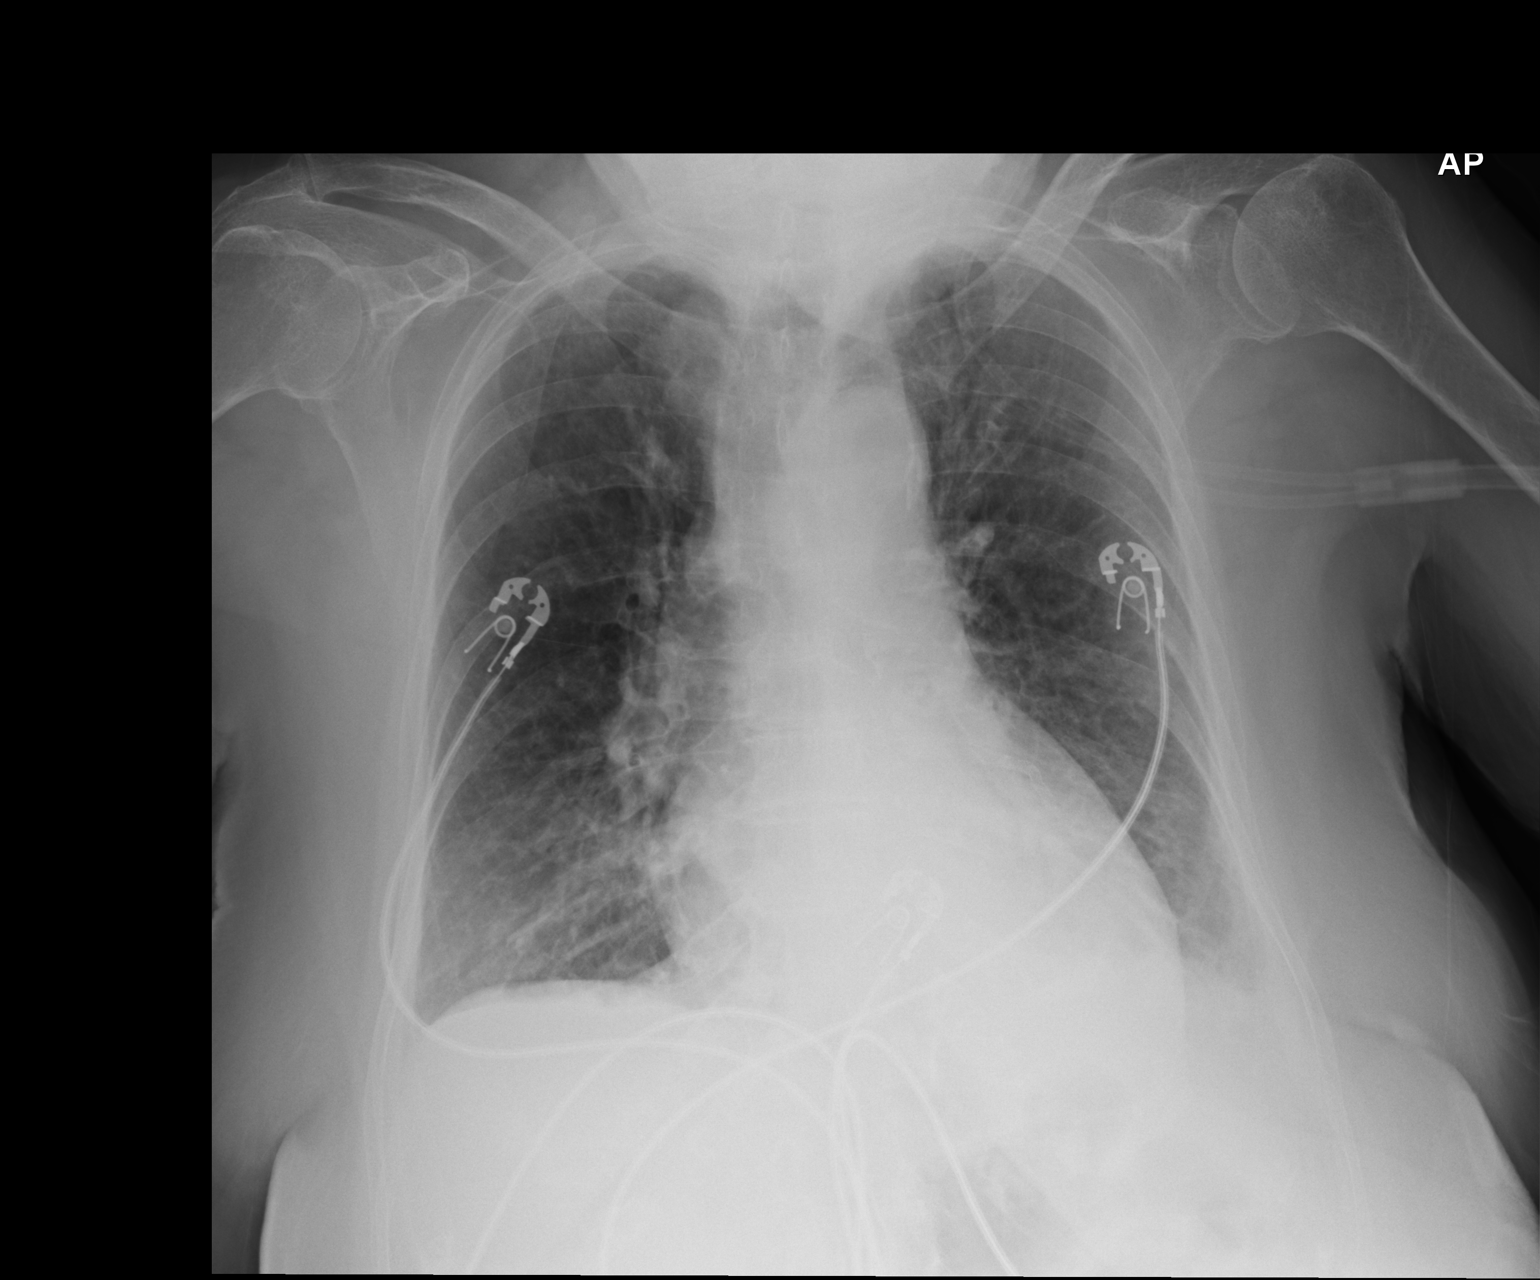

[1 of 1 positions shown; findings below may reference images not displayed]

FINDINGS: Cardiac enlargement. Pulmonary vascularity normal. Negative for
edema

Mild bibasilar atelectasis/ infiltrate. Small left pleural effusion.
IMPRESSION: Negative for heart failure

Bibasilar atelectasis/ infiltrate and small left effusion. Possible
basilar pneumonia bilaterally.

## 2018-01-10 IMAGING — DX DG CHEST 2V
2 series · 2 of 2 positions shown · non-contrast
Comparison: 12/29/2015

CLINICAL DATA: Shortness of breath and congestion.

EXAM:
CHEST  2 VIEW

[w chest pa]
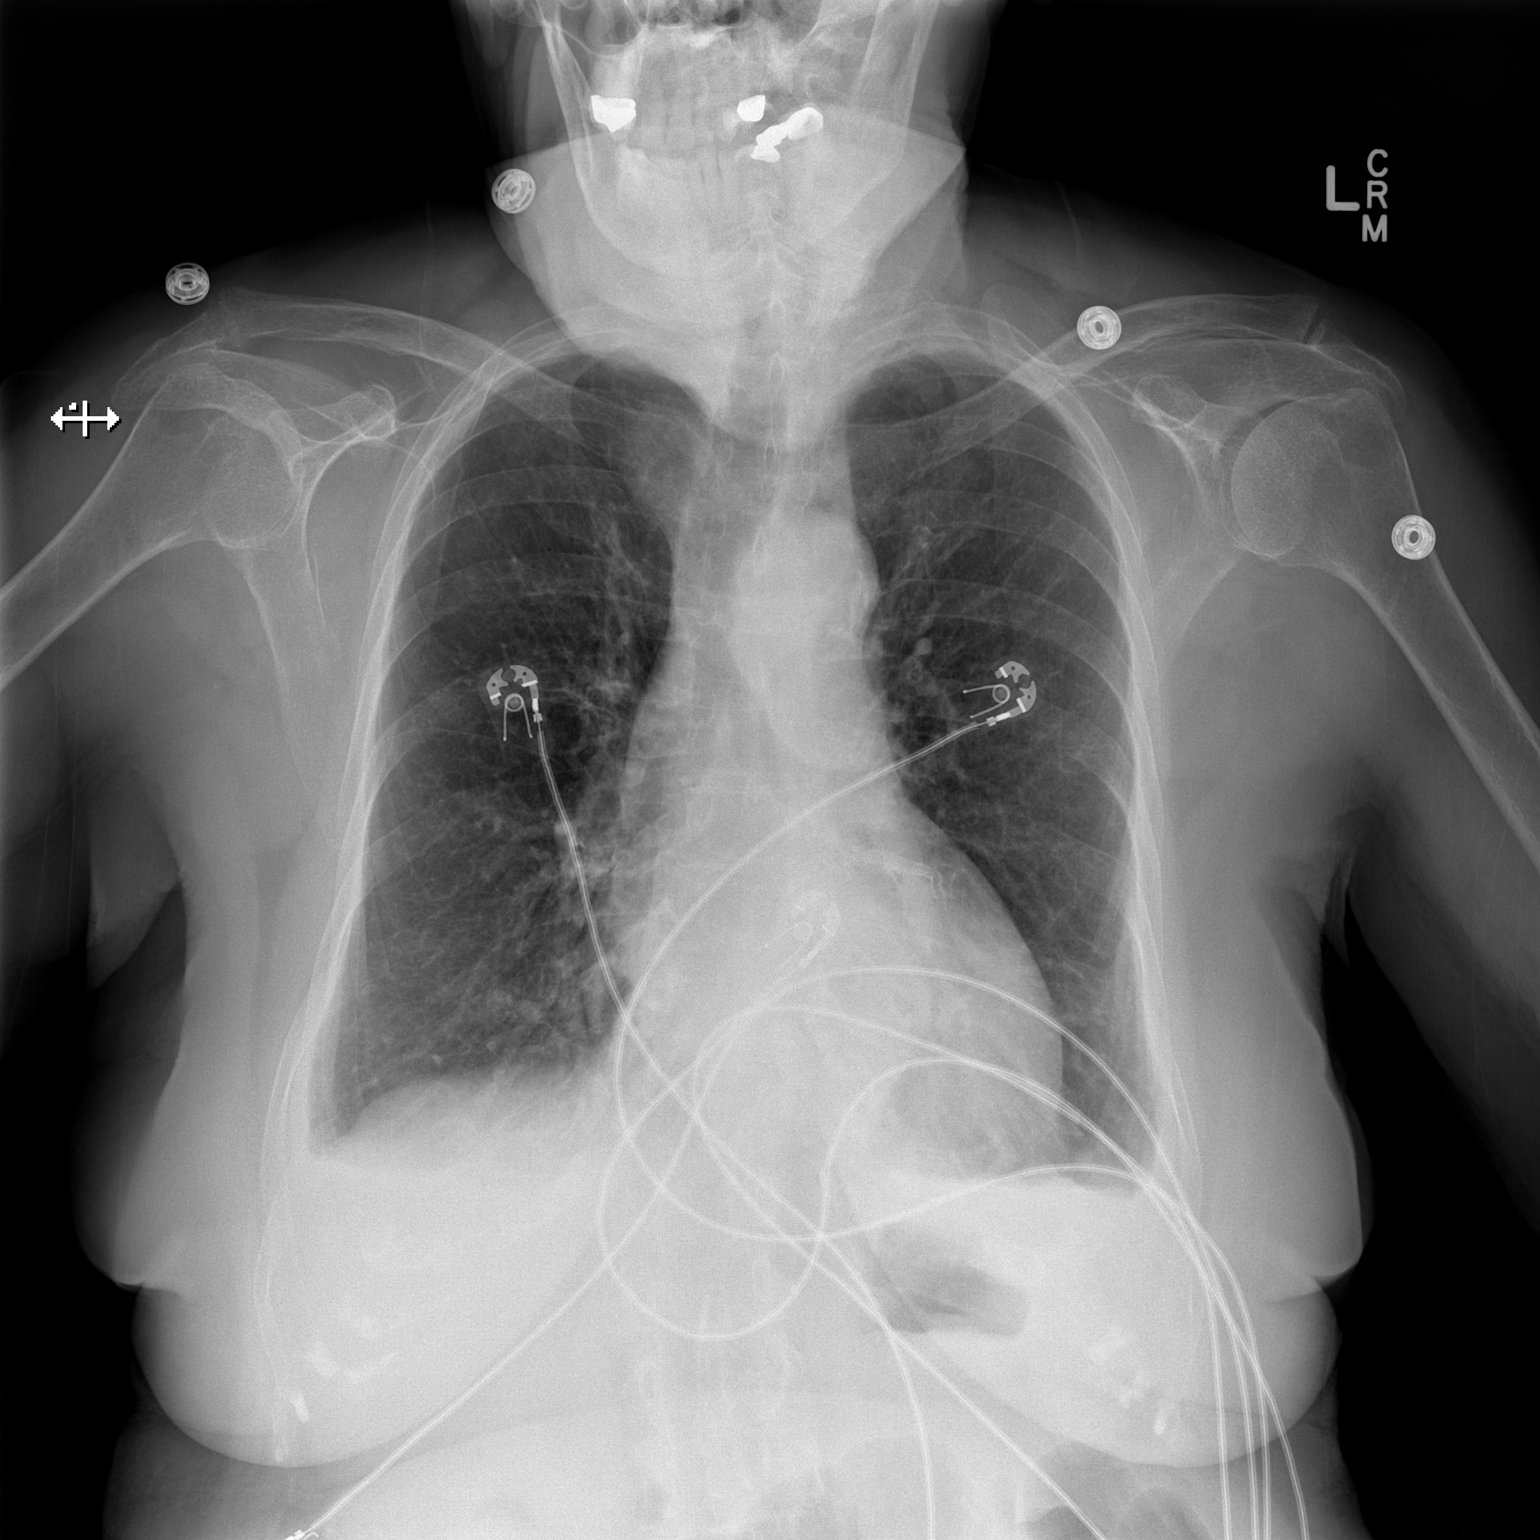

[w chest lat]
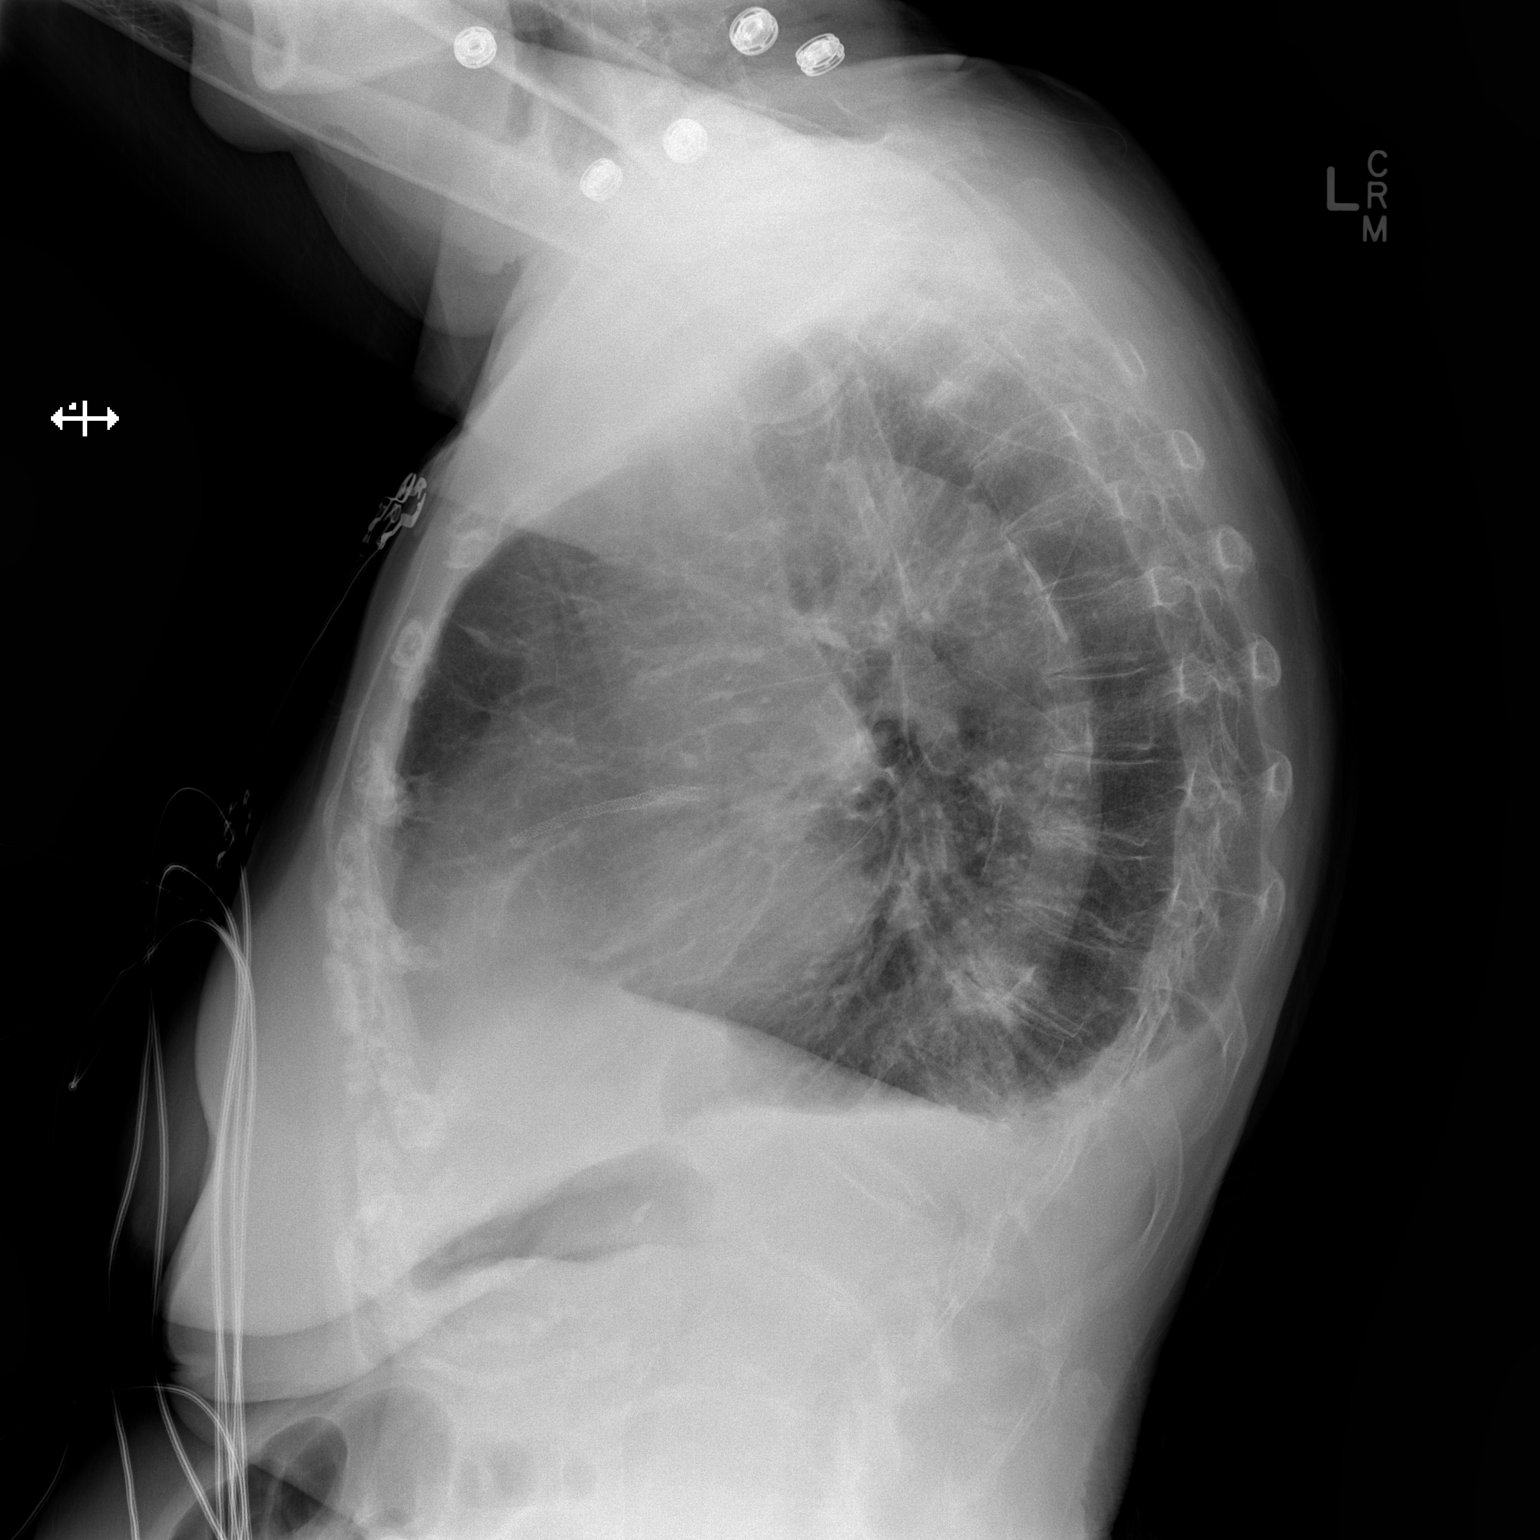

[2 of 2 positions shown; findings below may reference images not displayed]

FINDINGS: Lungs are hyperexpanded. Hyperexpansion is consistent with
emphysema. Interstitial markings are diffusely coarsened with
chronic features. The lungs are clear wiithout focal pneumonia,
edema, or pneumothorax. Tiny bilateral pleural effusions are
evident. The cardiopericardial silhouette is within normal limits
for size. The visualized bony structures of the thorax are intact.
Telemetry leads overlie the chest.
IMPRESSION: Emphysema with tiny bilateral pleural effusions.

## 2018-03-23 ENCOUNTER — Ambulatory Visit: Payer: Medicare Other | Admitting: Physician Assistant

## 2018-03-23 ENCOUNTER — Encounter: Payer: Self-pay | Admitting: Physician Assistant

## 2018-03-23 VITALS — BP 108/64 | HR 69 | Ht 61.5 in | Wt 116.4 lb

## 2018-03-23 DIAGNOSIS — N183 Chronic kidney disease, stage 3 unspecified: Secondary | ICD-10-CM

## 2018-03-23 DIAGNOSIS — I2510 Atherosclerotic heart disease of native coronary artery without angina pectoris: Secondary | ICD-10-CM | POA: Diagnosis not present

## 2018-03-23 DIAGNOSIS — E785 Hyperlipidemia, unspecified: Secondary | ICD-10-CM | POA: Diagnosis not present

## 2018-03-23 DIAGNOSIS — I5022 Chronic systolic (congestive) heart failure: Secondary | ICD-10-CM

## 2018-03-23 DIAGNOSIS — I10 Essential (primary) hypertension: Secondary | ICD-10-CM

## 2018-03-23 NOTE — Progress Notes (Signed)
Cardiology Office Note:    Date:  03/23/2018   ID:  Sophia Snyder, DOB [birth year], MRN [id number]  PCP:  Cyndy Freeze, MD  Cardiologist:  Sherren Mocha, MD   Referring MD: Cyndy Freeze, MD   Chief Complaint  Patient presents with  . Follow-up    CHF, CAD    History of Present Illness:    Sophia Snyder is a 84 y.o. female with systolic heart failure,chronic kidney disease and coronary artery disease status post anterior ST elevation myocardial infarction in May 2017 treated with a drug-eluting stent x 2 to the proximal LAD. RCA is known to be chronically occluded. Ejection fraction was as low as 20-25% but improved to 30-35%.  ACE inhibitor has been stopped in the past secondary to worsening creatinine.  She was last seen in clinic 10/15/2017.  [name] returns for follow-up.  She is here with her daughter.  Since last seen, she has seen a back specialist.  She received one injection in her back pain is much improved.  She gets short of breath if she does too much activity.  Otherwise, she is doing well without chest pain, syncope, orthopnea, PND or lower extremity swelling.  She does get dizzy sometimes in the mornings.  This is more of a lightheadedness.     Prior CV studies:   The following studies were reviewed today:  Limited Echo 5/17 EF 30-35, diff HK and apical, anterior, anteroseptal, inferoseptal AK, No LV clot   Echo 5/17 Mod conc LVH, EF 30-35, anterior and anteroseptal AK and DK of apical septum and true apex, restrictive physio, mild TR, PASP 35   LHC 5/17 LAD ostial 100 LCx irregs RCA mid 100 with L-R collats (CTO) EF 20-25, 2+ MR PCI: 2.5 x 32 mm Synergy DES and 3 x 28 mm Synergy DES to LAD   Past Medical History:  Diagnosis Date  . CAD (coronary artery disease), native coronary artery    chronic rt coroanry artery occlusion  . Essential hypertension 12/30/2015  . Hyperlipidemia LDL goal <70 12/30/2015  . S/P angioplasty with stent 12/28/15   DES to  LAD  . ST elevation myocardial infarction (STEMI) of anterior wall ([place]) 12/28/2015   Surgical Hx: The patient  has a past surgical history that includes Cardiac catheterization (N/A, 12/28/2015) and Cardiac catheterization (N/A, 12/28/2015).   Current Medications: Current Meds  Medication Sig  . acetaminophen (TYLENOL ARTHRITIS PAIN) 650 MG CR tablet Take 1,300 mg by mouth daily as needed for pain.  Marland Kitchen aspirin EC 81 MG tablet Take 81 mg by mouth daily.  Marland Kitchen atorvastatin (LIPITOR) 80 MG tablet Take 1 tablet (80 mg total) by mouth daily at 6 PM.  . clopidogrel (PLAVIX) 75 MG tablet Take 1 tablet (75 mg total) by mouth daily.  . famotidine (PEPCID) 20 MG tablet TAKE 1 TABLET BY MOUTH DAILY  . furosemide (LASIX) 40 MG tablet Take 1 tablet (40 mg total) by mouth daily.  . hydrALAZINE (APRESOLINE) 10 MG tablet Take 1 tablet (10 mg total) by mouth 3 (three) times daily.  . nebivolol (BYSTOLIC) 10 MG tablet TAKE 1/2 TABLET BY MOUTH 2 TIMES DAILY.  . nitroGLYCERIN (NITROSTAT) 0.4 MG SL tablet Place 1 tablet (0.4 mg total) under the tongue every 5 (five) minutes x 3 doses as needed for chest pain.  Marland Kitchen OVER THE COUNTER MEDICATION Place 1 drop into both eyes daily as needed (allergies/ itching). Over the counter allergy eye drop  . OVER THE COUNTER MEDICATION  Place 1-2 sprays into both nostrils at bedtime. Allergy nose spray  . traMADol (ULTRAM) 50 MG tablet Take 50 mg by mouth every 8 (eight) hours as needed for moderate pain or severe pain.   Marland Kitchen trolamine salicylate (ASPERCREME) 10 % cream Apply 1 application topically daily as needed for muscle pain (shoulder/knee pain).     Allergies:   Patient has no known allergies.   Social History   Tobacco Use  . Smoking status: Never Smoker  . Smokeless tobacco: Never Used  Substance Use Topics  . Alcohol use: No  . Drug use: No     Family Hx: The patient's family history includes Heart attack (age of onset: 34) in her father; Transient ischemic attack  (age of onset: 43) in her mother.  ROS:   Please see the history of present illness.    Review of Systems  Constitution: Positive for decreased appetite.  Cardiovascular: Positive for dyspnea on exertion.  Musculoskeletal: Positive for back pain.  Neurological: Positive for dizziness.  Psychiatric/Behavioral: Positive for depression.   All other systems reviewed and are negative.   EKGs/Labs/Other Test Reviewed:    EKG:  EKG is  ordered today.  The ekg ordered today demonstrates normal sinus rhythm, heart rate 69, normal axis, nonspecific ST-T wave changes, QTC 428, similar to old tracings  Recent Labs: 09/24/2017: NT-Pro BNP 2,699 10/04/2017: BNP 193.3; BUN 34; Creatinine, Ser 2.33; Potassium 4.4; Sodium 137   Recent Lipid Panel Lab Results  Component Value Date/Time   CHOL 144 05/04/2016 11:39 AM   TRIG 139 05/04/2016 11:39 AM   HDL 63 05/04/2016 11:39 AM   CHOLHDL 2.3 05/04/2016 11:39 AM   LDLCALC 53 05/04/2016 11:39 AM   From KPN Tool: Cholesterol, total  140.000 m  06/25/2017 HDL    50.000 mg  06/25/2017 LDL    62.000 CA  06/25/2017 Triglycerides  140.000 m  06/25/2017 Hemoglobin   11.000 g/  06/25/2017 Creatinine, Serum  2.330   10/04/2017 Potassium  4.400   10/04/2017 ALT (SGPT)   18.000 U/L  06/25/2017 TSH    5.611   06/25/2017 BNP    193.300  10/04/2017 INR    1.160   12/28/2015 Platelets   192.000 x1  06/25/2017  Physical Exam:    VS:  BP 108/64   Pulse 69   Ht 5' 1.5" (1.562 m)   Wt 116 lb 6.4 oz (52.8 kg)   SpO2 96%   BMI 21.64 kg/m     Wt Readings from Last 3 Encounters:  03/23/18 116 lb 6.4 oz (52.8 kg)  10/15/17 118 lb 6.4 oz (53.7 kg)  09/24/17 119 lb 1.9 oz (54 kg)     Physical Exam  Constitutional: She is oriented to person, place, and time. She appears well-developed and well-nourished. No distress.  HENT:  Head: Normocephalic and atraumatic.  Eyes: No scleral icterus.  Neck: Neck supple.  Cardiovascular: Normal rate, regular rhythm, S1 normal, S2  normal and normal heart sounds.  No murmur heard. Pulmonary/Chest: Effort normal. She has no rales.  Abdominal: Soft.  Musculoskeletal: She exhibits no edema.  Neurological: She is alert and oriented to person, place, and time.  Skin: Skin is warm and dry.  Psychiatric: She has a normal mood and affect.    ASSESSMENT & PLAN:    Chronic systolic heart failure (HCC) EF 30-35.  NYHA 2.  Volume status appears stable.  Continue current management with hydralazine, nebivolol.  Her blood pressure will not tolerate the addition of  spironolactone or nitrates.  Given her advanced age, she is not a candidate for ICD.  Coronary artery disease involving native coronary artery of native heart without angina pectoris History of anterior myocardial infarction 2017 treated with drug-eluting stent x2 to the LAD.  She has a chronically occluded RCA.  She is doing well without anginal symptoms on her current regimen.  Continue aspirin, clopidogrel, high-dose statin, beta-blocker.  ACE inhibitor/ARB is no longer used due to issues with worsening creatinine.  Essential hypertension The patient's blood pressure is controlled on her current regimen.  Continue current therapy.   Hyperlipidemia LDL goal <70 LDL optimal on most recent lab work.  Continue current Rx.    Stage 3 chronic kidney disease (HCC) Recent creatinine stable.   Dispo:  Return in about 6 months (around 09/23/2018) for Routine Follow Up, w/ Dr. Burt Knack.   Medication Adjustments/Labs and Tests Ordered: Current medicines are reviewed at length with the patient today.  Concerns regarding medicines are outlined above.  Tests Ordered: Orders Placed This Encounter  Procedures  . EKG 12-Lead   Medication Changes: No orders of the defined types were placed in this encounter.   Signed, Richardson Dopp, PA-C  03/23/2018 11:08 AM    [facility] [address], [place], [place]  [address] Phone: [phone]; Fax: [phone]

## 2018-03-23 NOTE — Patient Instructions (Signed)
Medication Instructions:  1. Your physician recommends that you continue on your current medications as directed. Please refer to the Current Medication list given to you today.   Labwork: NONE ORDERED TODAY  Testing/Procedures: NONE ORDERED TODAY  Follow-Up: 6 MONTH FOLLOW UP WITH DR. Burt Knack   Any Other Special Instructions Will Be Listed Below (If Applicable).     If you need a refill on your cardiac medications before your next appointment, please call your pharmacy.

## 2018-04-11 ENCOUNTER — Other Ambulatory Visit: Payer: Self-pay | Admitting: Cardiovascular Disease

## 2018-04-28 DIAGNOSIS — S233XXD Sprain of ligaments of thoracic spine, subsequent encounter: Secondary | ICD-10-CM | POA: Diagnosis not present

## 2018-04-28 DIAGNOSIS — M7918 Myalgia, other site: Secondary | ICD-10-CM | POA: Diagnosis not present

## 2018-06-11 ENCOUNTER — Other Ambulatory Visit: Payer: Self-pay | Admitting: Cardiovascular Disease

## 2018-07-02 ENCOUNTER — Other Ambulatory Visit: Payer: Self-pay | Admitting: Cardiovascular Disease

## 2018-07-08 DIAGNOSIS — Z23 Encounter for immunization: Secondary | ICD-10-CM | POA: Diagnosis not present

## 2018-07-19 DIAGNOSIS — I2510 Atherosclerotic heart disease of native coronary artery without angina pectoris: Secondary | ICD-10-CM | POA: Diagnosis not present

## 2018-07-19 DIAGNOSIS — R5383 Other fatigue: Secondary | ICD-10-CM | POA: Diagnosis not present

## 2018-07-19 DIAGNOSIS — Z0000 Encounter for general adult medical examination without abnormal findings: Secondary | ICD-10-CM | POA: Diagnosis not present

## 2018-07-19 DIAGNOSIS — N183 Chronic kidney disease, stage 3 (moderate): Secondary | ICD-10-CM | POA: Diagnosis not present

## 2018-07-19 DIAGNOSIS — E559 Vitamin D deficiency, unspecified: Secondary | ICD-10-CM | POA: Diagnosis not present

## 2018-07-19 DIAGNOSIS — M1990 Unspecified osteoarthritis, unspecified site: Secondary | ICD-10-CM | POA: Diagnosis not present

## 2018-07-19 DIAGNOSIS — Z79899 Other long term (current) drug therapy: Secondary | ICD-10-CM | POA: Diagnosis not present

## 2018-07-19 DIAGNOSIS — Z6822 Body mass index (BMI) 22.0-22.9, adult: Secondary | ICD-10-CM | POA: Diagnosis not present

## 2018-07-20 ENCOUNTER — Telehealth: Payer: Self-pay | Admitting: Cardiovascular Disease

## 2018-07-20 NOTE — Telephone Encounter (Signed)
Pt daughter, Butch Penny, on [place].Marland KitchenCalled to report that her PMD Dr. Lin Landsman with [facility] has put in a referral for a Nephrology Consult..she wanted to know if she should see Dr.Cooper/Scott first but I advised her that it is her and her moms choice if they would like the added management of her kidneys.. I explained to her that Dr. Burt Knack is okay with what she feels comfortable doing in regards to her moms care. I did suggest that if she has a consult to possibly talk with Dr. Lin Landsman about seeing a physician in the [facility] system since we have multiple labs in our system that would make consulting with her much easier but if not we can send records anywhere that we need to. She says she plans on taking her but wanted to be sure Dr. Burt Knack is okay with it since we have been managing her fluid pills.. I reassured her that we will still be managing her heart care and will work with the Nephrologist if she chooses to take her to see one.  Advised her that I will forward to Dr. Burt Knack.. And if he has any other recommendations that we will most definitely call her.. She was very appreciative for my call.

## 2018-07-20 NOTE — Telephone Encounter (Signed)
  Patients PCP did blood work and called and said her kidney function is off and he wants to send her to a kidney specialist. The daughter would know if she should bring her in to be seen by Dr Burt Knack or Richardson Dopp instead of the specialist

## 2018-07-21 NOTE — Telephone Encounter (Signed)
I'm fine with her seeing a nephrologist. thanks

## 2018-07-25 NOTE — Telephone Encounter (Signed)
Per DPR form, left message reiterating that Dr. Burt Knack is [place] with Ms. Cibrian seeing a nephrologist. Instructed her to call if she has further questions or concerns.

## 2018-10-10 ENCOUNTER — Telehealth: Payer: Self-pay | Admitting: Physician Assistant

## 2018-10-10 ENCOUNTER — Other Ambulatory Visit: Payer: Self-pay | Admitting: Physician Assistant

## 2018-10-10 MED ORDER — HYDRALAZINE HCL 10 MG PO TABS: 10.0000 mg | ORAL_TABLET | Freq: Three times a day (TID) | ORAL | 3 refills | Status: DC

## 2018-10-10 NOTE — Telephone Encounter (Signed)
Confirmed with pharmacy the patient is to take hydralazine 10 mg TID. New Rx called in.

## 2018-10-10 NOTE — Telephone Encounter (Signed)
New Message:   Please call, wants to verify the amount of her Hydralazine prescription please.

## 2018-10-25 ENCOUNTER — Encounter: Payer: Self-pay | Admitting: Physician Assistant

## 2018-10-25 ENCOUNTER — Ambulatory Visit: Payer: Medicare Other | Admitting: Physician Assistant

## 2018-10-25 VITALS — BP 126/76 | HR 75 | Ht 61.5 in | Wt 116.0 lb

## 2018-10-25 DIAGNOSIS — E785 Hyperlipidemia, unspecified: Secondary | ICD-10-CM | POA: Diagnosis not present

## 2018-10-25 DIAGNOSIS — I5022 Chronic systolic (congestive) heart failure: Secondary | ICD-10-CM | POA: Diagnosis not present

## 2018-10-25 DIAGNOSIS — I2510 Atherosclerotic heart disease of native coronary artery without angina pectoris: Secondary | ICD-10-CM | POA: Diagnosis not present

## 2018-10-25 DIAGNOSIS — N183 Chronic kidney disease, stage 3 unspecified: Secondary | ICD-10-CM

## 2018-10-25 DIAGNOSIS — I255 Ischemic cardiomyopathy: Secondary | ICD-10-CM | POA: Diagnosis not present

## 2018-10-25 DIAGNOSIS — I10 Essential (primary) hypertension: Secondary | ICD-10-CM

## 2018-10-25 NOTE — Patient Instructions (Signed)
Medication Instructions:   Your physician recommends that you continue on your current medications as directed. Please refer to the Current Medication list given to you today.   If you need a refill on your cardiac medications before your next appointment, please call your pharmacy.   Lab work: NONE ORDERED  TODAY   If you have labs (blood work) drawn today and your tests are completely normal, you will receive your results only by: Marland Kitchen MyChart Message (if you have MyChart) OR . A paper copy in the mail If you have any lab test that is abnormal or we need to change your treatment, we will call you to review the results.  Testing/Procedures: Your physician has requested that you have an echocardiogram. Echocardiography is a painless test that uses sound waves to create images of your heart. It provides your doctor with information about the size and shape of your heart and how well your heart's chambers and valves are working. This procedure takes approximately one hour. There are no restrictions for this procedure.  Follow-Up: At [facility], you and your health needs are our priority.  As part of our continuing mission to provide you with exceptional heart care, we have created designated Provider Care Teams.  These Care Teams include your primary Cardiologist (physician) and Advanced Practice Providers (APPs -  Physician Assistants and Nurse Practitioners) who all work together to provide you with the care you need, when you need it. You will need a follow up appointment in:  6 months.  Please call our office 2 months in advance to schedule this appointment.  You may see Sherren Mocha, MD or one of the following Advanced Practice Providers on your designated Care Team: Richardson Dopp, PA-C   Any Other Special Instructions Will Be Listed Below (If Applicable).

## 2018-10-25 NOTE — Progress Notes (Signed)
Cardiology Office Note:    Date:  10/25/2018   ID:  Sophia Snyder, DOB 07-26-1919, MRN [id number]  PCP:  Angelina Sheriff, MD  Cardiologist:  Sherren Mocha, MD   Electrophysiologist:  None   Referring MD: Cyndy Freeze, MD   Chief Complaint  Patient presents with  . Follow-up    CAD, CHF     History of Present Illness:    Sophia Snyder is a 85 y.o. female with systolic HF, CKD, CAD s/p anterior STEMI in 5/17 tx with a DES x 2 to the proximal LAD.  She has a chronically occluded RCA.  Her EF was as low as 20-25 but improved to 30-35.  Her ACE inhibitor was previously stopped due to worsening Creatinine.     [name] returns for follow up.  She is here with her daughter.  She has not had any chest pain, syncope, paroxysmal nocturnal dyspnea, leg swelling. She does have fairly chronic dyspnea on exertion that is unchanged.  She has not had any bleeding issues.  She has a lot of back pain and has had ESI in the past with some relief.    Prior CV studies:   The following studies were reviewed today:  Limited Echo 5/17 EF 30-35, diff HK and apical, anterior, anteroseptal, inferoseptal AK, No LV clot  Echo 5/17 Mod conc LVH, EF 30-35, anterior and anteroseptal AK and DK of apical septum and true apex, restrictive physio, mild TR, PASP 35  LHC 5/17 LAD ostial 100 LCx irregs RCA mid 100 with L-R collats (CTO) EF 20-25, 2+ MR PCI: 2.5 x 32 mm Synergy DES and 3 x 28 mm Synergy DES to LAD  Past Medical History:  Diagnosis Date  . CAD (coronary artery disease), native coronary artery    chronic rt coroanry artery occlusion  . Essential hypertension 12/30/2015  . Hyperlipidemia LDL goal <70 12/30/2015  . S/P angioplasty with stent 12/28/15   DES to LAD  . ST elevation myocardial infarction (STEMI) of anterior wall ([place]) 12/28/2015   Surgical Hx: The patient  has a past surgical history that includes Cardiac catheterization (N/A, 12/28/2015) and Cardiac catheterization (N/A,  12/28/2015).   Current Medications: Current Meds  Medication Sig  . acetaminophen (TYLENOL ARTHRITIS PAIN) 650 MG CR tablet Take 1,300 mg by mouth daily as needed for pain.  Marland Kitchen aspirin EC 81 MG tablet Take 81 mg by mouth daily.  Marland Kitchen atorvastatin (LIPITOR) 80 MG tablet TAKE 1 TABLET BY MOUTH DAILY AT 6 PM.  . clopidogrel (PLAVIX) 75 MG tablet TAKE 1 TABLET BY MOUTH DAILY  . famotidine (PEPCID) 20 MG tablet TAKE 1 TABLET BY MOUTH DAILY  . furosemide (LASIX) 40 MG tablet TAKE 1 TABLET BY MOUTH DAILY.  . hydrALAZINE (APRESOLINE) 10 MG tablet Take 1 tablet (10 mg total) by mouth 3 (three) times daily.  . nebivolol (BYSTOLIC) 10 MG tablet TAKE 1/2 TABLET BY MOUTH 2 TIMES DAILY.  . nitroGLYCERIN (NITROSTAT) 0.4 MG SL tablet Place 1 tablet (0.4 mg total) under the tongue every 5 (five) minutes x 3 doses as needed for chest pain.  Marland Kitchen OVER THE COUNTER MEDICATION Place 1 drop into both eyes daily as needed (allergies/ itching). Over the counter allergy eye drop  . OVER THE COUNTER MEDICATION Place 1-2 sprays into both nostrils at bedtime. Allergy nose spray  . traMADol (ULTRAM) 50 MG tablet Take 50 mg by mouth every 8 (eight) hours as needed for moderate pain or severe pain.   Marland Kitchen  trolamine salicylate (ASPERCREME) 10 % cream Apply 1 application topically daily as needed for muscle pain (shoulder/knee pain).     Allergies:   Patient has no known allergies.   Social History   Tobacco Use  . Smoking status: Never Smoker  . Smokeless tobacco: Never Used  Substance Use Topics  . Alcohol use: No  . Drug use: No     Family Hx: The patient's family history includes Heart attack (age of onset: 9) in her father; Transient ischemic attack (age of onset: 106) in her mother.  ROS:   Please see the history of present illness.    Review of Systems  Respiratory: Positive for shortness of breath.   Musculoskeletal: Positive for back pain.   All other systems reviewed and are negative.   EKGs/Labs/Other Test  Reviewed:    EKG:  EKG is  ordered today.  The ekg ordered today demonstrates normal sinus rhythm, HR 75, normal axis, LVH with TW inversions in 1, aVL, QTc 424; somewhat similar to old ECGs  Recent Labs: No results found for requested labs within last 8760 hours.   Recent Lipid Panel Lab Results  Component Value Date/Time   CHOL 144 05/04/2016 11:39 AM   TRIG 139 05/04/2016 11:39 AM   HDL 63 05/04/2016 11:39 AM   CHOLHDL 2.3 05/04/2016 11:39 AM   LDLCALC 53 05/04/2016 11:39 AM   From KPN Tool   Physical Exam:    VS:  BP 126/76   Pulse 75   Ht 5' 1.5" (1.562 m)   Wt 116 lb (52.6 kg)   BMI 21.56 kg/m     Wt Readings from Last 3 Encounters:  10/25/18 116 lb (52.6 kg)  03/23/18 116 lb 6.4 oz (52.8 kg)  10/15/17 118 lb 6.4 oz (53.7 kg)     Physical Exam  Constitutional: She is oriented to person, place, and time. She appears well-developed and well-nourished. No distress.  HENT:  Head: Normocephalic and atraumatic.  Eyes: No scleral icterus.  Neck: Neck supple. No thyromegaly present.  Cardiovascular: Normal rate, regular rhythm, S1 normal, S2 normal and normal heart sounds.  No murmur heard. Pulmonary/Chest: Effort normal. She has no rales.  Abdominal: Soft. There is no hepatomegaly.  Musculoskeletal:        General: No edema.  Lymphadenopathy:    She has no cervical adenopathy.  Neurological: She is alert and oriented to person, place, and time.  Skin: Skin is warm and dry.  Psychiatric: She has a normal mood and affect.    ASSESSMENT & PLAN:    Chronic systolic heart failure (HCC) / Ischemic cardiomyopathy EF 30-35.  NYHA 2.  Volume appears stable.  Continue beta-blocker, hydralazine.  She was taken off of ACE inhibitor in the past due to worsening creatinine.  She has known apical wall motion abnormality on Echo.  Her TW inversions in 1, aVL are more prominent today on ECG.  I will update her Echo to ensure her apex is not aneurysmal and to rule out the  possibility of LV thrombus.    Coronary artery disease involving native coronary artery of native heart without angina pectoris Hx of Ant MI in 2017 tx with DES x 2 to LAD.  The RCA is chronically occluded.  She is not having angina.  Continue ASA, Plavix, beta-blocker, statin. I will review with Dr. Burt Knack +/- stopping ASA as she is almost 3 years out from her ACS.    Essential hypertension The patient's blood pressure is controlled on  her current regimen.  Continue current therapy.    Hyperlipidemia LDL goal <70 LDL optimal on most recent lab work.  Continue current Rx.    Stage 3 chronic kidney disease (HCC) Recent creatinine stable.   Dispo:  Return in about 6 months (around 04/27/2019) for Routine Follow Up, w/ Dr. Burt Knack, or Richardson Dopp, PA-C.   Medication Adjustments/Labs and Tests Ordered: Current medicines are reviewed at length with the patient today.  Concerns regarding medicines are outlined above.  Tests Ordered: Orders Placed This Encounter  Procedures  . EKG 12-Lead  . ECHOCARDIOGRAM COMPLETE   Medication Changes: No orders of the defined types were placed in this encounter.   Signed, Richardson Dopp, PA-C  10/25/2018 5:56 PM    [facility] [address], [place], [place]  [address] Phone: [phone]; Fax: [phone]

## 2018-10-27 ENCOUNTER — Telehealth: Payer: Self-pay | Admitting: Physician Assistant

## 2018-10-27 NOTE — Telephone Encounter (Signed)
SPOKE WITH PT AND PT AWARE OF MEDICINE CHANGES  AND VERBALIZED UNDERSTANDING   PT THANKED ME FOR CALLING

## 2018-10-27 NOTE — Progress Notes (Signed)
Reviewed her chart and procedural note from cath. Would continue plavix and stop aspirin at this point. thanks

## 2018-10-27 NOTE — Telephone Encounter (Signed)
-----   Message from Sherren Mocha, MD sent at 10/27/2018  3:14 PM EDT -----   ----- Message ----- From: Sharmon Revere Sent: 10/25/2018   5:56 PM EDT To: Sherren Mocha, MD  Ronalee Belts - She has been on dual antiplatelet Rx since her MI.  Should I DC her ASA and keep her on Plavix or do you think she should remain on both. Thanks, [employer]

## 2018-10-27 NOTE — Telephone Encounter (Signed)
Please call the patient. Tell her I reviewed her case with Dr. Burt Knack. Tell her that she can now stop ASA. She needs to continue taking Plavix. Richardson Dopp, PA-C    10/27/2018 4:24 PM

## 2018-11-01 ENCOUNTER — Other Ambulatory Visit (HOSPITAL_COMMUNITY): Payer: Medicare Other

## 2019-01-24 ENCOUNTER — Other Ambulatory Visit: Payer: Self-pay | Admitting: Cardiology

## 2019-01-24 DIAGNOSIS — Z6822 Body mass index (BMI) 22.0-22.9, adult: Secondary | ICD-10-CM | POA: Diagnosis not present

## 2019-01-24 DIAGNOSIS — Z79899 Other long term (current) drug therapy: Secondary | ICD-10-CM | POA: Diagnosis not present

## 2019-01-24 DIAGNOSIS — Z9181 History of falling: Secondary | ICD-10-CM | POA: Diagnosis not present

## 2019-01-24 DIAGNOSIS — M8580 Other specified disorders of bone density and structure, unspecified site: Secondary | ICD-10-CM | POA: Diagnosis not present

## 2019-01-24 DIAGNOSIS — I10 Essential (primary) hypertension: Secondary | ICD-10-CM | POA: Diagnosis not present

## 2019-01-24 DIAGNOSIS — N183 Chronic kidney disease, stage 3 (moderate): Secondary | ICD-10-CM | POA: Diagnosis not present

## 2019-01-24 DIAGNOSIS — Z780 Asymptomatic menopausal state: Secondary | ICD-10-CM | POA: Diagnosis not present

## 2019-01-24 DIAGNOSIS — D649 Anemia, unspecified: Secondary | ICD-10-CM | POA: Diagnosis not present

## 2019-01-24 NOTE — Telephone Encounter (Signed)
Pt's pharmacy is requesting a refill on famotidine. Would Dr. Burt Knack like to refill this medication? Please address

## 2019-01-30 ENCOUNTER — Telehealth (HOSPITAL_COMMUNITY): Payer: Self-pay | Admitting: Radiology

## 2019-01-30 NOTE — Telephone Encounter (Signed)
Called to schedule echocardiogram. Patient says I need to call daughter. I called number listed for daughter. The phone is not accepting calls at this time. Patient will have daughter call back.

## 2019-02-14 DIAGNOSIS — M8008XA Age-related osteoporosis with current pathological fracture, vertebra(e), initial encounter for fracture: Secondary | ICD-10-CM | POA: Diagnosis not present

## 2019-02-14 DIAGNOSIS — M7918 Myalgia, other site: Secondary | ICD-10-CM | POA: Diagnosis not present

## 2019-02-14 DIAGNOSIS — S233XXD Sprain of ligaments of thoracic spine, subsequent encounter: Secondary | ICD-10-CM | POA: Diagnosis not present

## 2019-02-24 ENCOUNTER — Other Ambulatory Visit (HOSPITAL_COMMUNITY): Payer: Medicare Other

## 2019-02-27 ENCOUNTER — Other Ambulatory Visit: Payer: Self-pay

## 2019-02-27 ENCOUNTER — Ambulatory Visit (HOSPITAL_COMMUNITY): Payer: Medicare Other | Attending: Internal Medicine

## 2019-02-27 ENCOUNTER — Encounter (INDEPENDENT_AMBULATORY_CARE_PROVIDER_SITE_OTHER): Payer: Self-pay

## 2019-02-27 DIAGNOSIS — I5022 Chronic systolic (congestive) heart failure: Secondary | ICD-10-CM

## 2019-02-27 DIAGNOSIS — I255 Ischemic cardiomyopathy: Secondary | ICD-10-CM | POA: Diagnosis not present

## 2019-02-28 ENCOUNTER — Telehealth: Payer: Self-pay | Admitting: *Deleted

## 2019-02-28 NOTE — Telephone Encounter (Signed)
Lvm on DTR cell phone device of results and clinic number to call back if any questions or concerns

## 2019-03-06 ENCOUNTER — Other Ambulatory Visit: Payer: Self-pay | Admitting: Cardiovascular Disease

## 2019-04-07 ENCOUNTER — Other Ambulatory Visit: Payer: Self-pay | Admitting: Cardiovascular Disease

## 2019-05-02 DIAGNOSIS — I10 Essential (primary) hypertension: Secondary | ICD-10-CM | POA: Diagnosis not present

## 2019-05-02 DIAGNOSIS — Z6822 Body mass index (BMI) 22.0-22.9, adult: Secondary | ICD-10-CM | POA: Diagnosis not present

## 2019-05-02 DIAGNOSIS — Z23 Encounter for immunization: Secondary | ICD-10-CM | POA: Diagnosis not present

## 2019-05-02 DIAGNOSIS — D649 Anemia, unspecified: Secondary | ICD-10-CM | POA: Diagnosis not present

## 2019-05-02 DIAGNOSIS — M1990 Unspecified osteoarthritis, unspecified site: Secondary | ICD-10-CM | POA: Diagnosis not present

## 2019-05-09 NOTE — Progress Notes (Signed)
Cardiology Office Note:    Date:  05/10/2019   ID:  Sophia Snyder, DOB 03/30/1919, MRN [id number]  PCP:  Angelina Sheriff, MD  Cardiologist:  Sherren Mocha, MD  Electrophysiologist:  None   Referring MD: Angelina Sheriff, MD   Chief Complaint  Patient presents with   Follow-up    CAD, CHF    History of Present Illness:    Sophia Snyder is a 85 y.o. female with:  Chronic Systolic CHF  Ischemic CM  ACEi DC'd in past due to increasing Creatinine   EF 20-25 >> 30-35  Echocardiogram 02/2019: EF 30-35, Gr 1 DD  Chronic kidney disease   Coronary artery disease   S/p Ant STEMI 5/17 >> DES x 2 to pLAD; RCA with CTO  Hypertension   Hyperlipidemia   Chronic kidney disease 3  Ms. Yohn was last seen in 10/2018.  I stopped her ASA at that time.  A follow up echocardiogram in 02/2019 demonstrated s/w improved LVF with EF 30-86% and mild diastolic dysfunction.  She returns for follow-up.  She is here with her daughter.  Since last seen, she has not had chest discomfort.  She does note that she has more shortness of breath with moderate to extreme activities.  She has not had orthopnea or paroxysmal nocturnal dyspnea.  She has not noticed any weight gain.  She has not had lower extremity swelling.  She has not had syncope.  Prior CV studies:   The following studies were reviewed today:  Echocardiogram 02/27/2019 EF 30-35, mild LVH, Gr 1 DD, inf and ant severe HK  Limited Echo 5/17 EF 30-35, diff HK and apical, anterior, anteroseptal, inferoseptal AK, No LV clot  Echo 5/17 Mod conc LVH, EF 30-35, anterior and anteroseptal AK and DK of apical septum and true apex, restrictive physio, mild TR, PASP 35  LHC 5/17 LAD ostial 100 LCx irregs RCA mid 100 with L-R collats (CTO) EF 20-25, 2+ MR PCI: 2.5 x 32 mm Synergy DES and 3 x 28 mm Synergy DES to LAD  Past Medical History:  Diagnosis Date   CAD (coronary artery disease), native coronary artery    chronic rt  coroanry artery occlusion   Essential hypertension 12/30/2015   Hyperlipidemia LDL goal <70 12/30/2015   S/P angioplasty with stent 12/28/15   DES to LAD   ST elevation myocardial infarction (STEMI) of anterior wall ([place]) 12/28/2015   Surgical Hx: The patient  has a past surgical history that includes Cardiac catheterization (N/A, 12/28/2015) and Cardiac catheterization (N/A, 12/28/2015).   Current Medications: Current Meds  Medication Sig   atorvastatin (LIPITOR) 80 MG tablet TAKE 1 TABLET BY MOUTH DAILY AT 6 PM.   clopidogrel (PLAVIX) 75 MG tablet Take 1 tablet (75 mg total) by mouth daily. Please make annual appt with Dr. Burt Knack for future refills. Thank you. 1st attempt.   famotidine (PEPCID) 20 MG tablet 1TD   furosemide (LASIX) 40 MG tablet Take 1 tablet (40 mg total) by mouth daily. Please make annual appt with Dr. Burt Knack for future refills. Thank you. 1st attempt.   hydrALAZINE (APRESOLINE) 10 MG tablet Take 1 tablet (10 mg total) by mouth 3 (three) times daily.   nebivolol (BYSTOLIC) 10 MG tablet TAKE 1/2 TABLET BY MOUTH TWICE A DAY.   nitroGLYCERIN (NITROSTAT) 0.4 MG SL tablet Place 1 tablet (0.4 mg total) under the tongue every 5 (five) minutes x 3 doses as needed for chest pain.   OVER THE  COUNTER MEDICATION Place 1 drop into both eyes daily as needed (allergies/ itching). Over the counter allergy eye drop   OVER THE COUNTER MEDICATION Place 1-2 sprays into both nostrils at bedtime. Allergy nose spray   traMADol (ULTRAM) 50 MG tablet Take 50 mg by mouth every 8 (eight) hours as needed for moderate pain or severe pain.    trolamine salicylate (ASPERCREME) 10 % cream Apply 1 application topically daily as needed for muscle pain (shoulder/knee pain).     Allergies:   Patient has no known allergies.   Social History   Tobacco Use   Smoking status: Never Smoker   Smokeless tobacco: Never Used  Substance Use Topics   Alcohol use: No   Drug use: No     Family  Hx: The patient's family history includes Heart attack (age of onset: 59) in her father; Transient ischemic attack (age of onset: 8) in her mother.  ROS:   Please see the history of present illness.    Review of Systems  Respiratory: Negative for cough.   Gastrointestinal: Negative for hematochezia.  Genitourinary: Negative for hematuria.   All other systems reviewed and are negative.   EKGs/Labs/Other Test Reviewed:    EKG:  EKG is  ordered today.  The ekg ordered today demonstrates normal sinus rhythm, heart rate 78, normal axis, T wave inversions 1, aVL, V2-V3, QTC 412, similar to old tracings  Recent Labs: 05/10/2019: BUN 36; Creatinine, Ser 2.40; Hemoglobin 10.4; NT-Pro BNP WILL FOLLOW; Platelets 173; Potassium 5.0; Sodium 138   Recent Lipid Panel Lab Results  Component Value Date/Time   CHOL 144 05/04/2016 11:39 AM   TRIG 139 05/04/2016 11:39 AM   HDL 63 05/04/2016 11:39 AM   CHOLHDL 2.3 05/04/2016 11:39 AM   LDLCALC 53 05/04/2016 11:39 AM     Physical Exam:    VS:  BP (!) 112/58    Pulse 78    Ht 5\' 1"  (1.549 m)    Wt 110 lb (49.9 kg)    BMI 20.78 kg/m     Wt Readings from Last 3 Encounters:  05/10/19 110 lb (49.9 kg)  10/25/18 116 lb (52.6 kg)  03/23/18 116 lb 6.4 oz (52.8 kg)     Physical Exam  Constitutional: She is oriented to person, place, and time. She appears well-developed and well-nourished. No distress.  HENT:  Head: Normocephalic and atraumatic.  Eyes: No scleral icterus.  Neck: No thyromegaly present.  Cardiovascular: Normal rate, regular rhythm and normal heart sounds.  No murmur heard. Pulmonary/Chest: Effort normal and breath sounds normal. She has no rales.  Abdominal: Soft. There is no hepatomegaly.  Musculoskeletal:        General: No edema.  Lymphadenopathy:    She has no cervical adenopathy.  Neurological: She is alert and oriented to person, place, and time.  Skin: Skin is warm and dry.  Psychiatric: She has a normal mood and  affect.    ASSESSMENT & PLAN:    1. Chronic systolic heart failure ([place]) 2. Shortness of breath Ischemic cardiomyopathy.  EF now 30-35%.  She does note worsening shortness of breath.  She is NYHA 2-2b.  Volume status appears normal on exam.  She does have chronic kidney disease as well as a history of anemia.  Question if her anemia has worsened.  -BMET, BNP, CBC  -If BNP significantly elevated, increase furosemide  -Follow-up 6 months with me or Dr. Burt Knack  3. Coronary artery disease involving native coronary artery of native heart  without angina pectoris Hx of Ant MI in 2017 tx with DES x 2 to LAD.  The RCA is chronically occluded.  She is doing well without anginal symptoms.  Continue clopidogrel, atorvastatin, nebivolol.  4. Stage 3 chronic kidney disease ([place]) Most recent creatinine in December 2019 was 2.3.  Repeat BMET will be obtained today.  If she has worsening anemia and worsening creatinine, she would likely benefit from referral to nephrology.  5. Essential hypertension The patient's blood pressure is controlled on her current regimen.  Continue current therapy.   6. Hyperlipidemia LDL goal <70 LDL optimal on most recent lab work.  Continue current Rx.     Dispo:  Return in about 6 months (around 11/07/2019) for Routine Follow Up, w/ Dr. Burt Knack, or Richardson Dopp, PA-C, (virtual or in-person).   Medication Adjustments/Labs and Tests Ordered: Current medicines are reviewed at length with the patient today.  Concerns regarding medicines are outlined above.  Tests Ordered: Orders Placed This Encounter  Procedures   Basic Metabolic Panel (BMET)   CBC   Pro b natriuretic peptide (BNP)   EKG 12-Lead   Medication Changes: No orders of the defined types were placed in this encounter.   Signed, Richardson Dopp, PA-C  05/10/2019 5:36 PM    [facility] [address], [place], [place]  [address] Phone: [phone]; Fax: [phone]

## 2019-05-10 ENCOUNTER — Other Ambulatory Visit: Payer: Self-pay

## 2019-05-10 ENCOUNTER — Ambulatory Visit (INDEPENDENT_AMBULATORY_CARE_PROVIDER_SITE_OTHER): Payer: Medicare Other | Admitting: Physician Assistant

## 2019-05-10 ENCOUNTER — Encounter: Payer: Self-pay | Admitting: Physician Assistant

## 2019-05-10 VITALS — BP 112/58 | HR 78 | Ht 61.0 in | Wt 110.0 lb

## 2019-05-10 DIAGNOSIS — N183 Chronic kidney disease, stage 3 unspecified: Secondary | ICD-10-CM

## 2019-05-10 DIAGNOSIS — I2510 Atherosclerotic heart disease of native coronary artery without angina pectoris: Secondary | ICD-10-CM | POA: Diagnosis not present

## 2019-05-10 DIAGNOSIS — R0602 Shortness of breath: Secondary | ICD-10-CM

## 2019-05-10 DIAGNOSIS — I5022 Chronic systolic (congestive) heart failure: Secondary | ICD-10-CM | POA: Diagnosis not present

## 2019-05-10 DIAGNOSIS — E785 Hyperlipidemia, unspecified: Secondary | ICD-10-CM

## 2019-05-10 DIAGNOSIS — I10 Essential (primary) hypertension: Secondary | ICD-10-CM

## 2019-05-10 NOTE — Patient Instructions (Signed)
Medication Instructions:  none If you need a refill on your cardiac medications before your next appointment, please call your pharmacy.   Lab work:TODAY BMET CBC BNP If you have labs (blood work) drawn today and your tests are completely normal, you will receive your results only by: Marland Kitchen MyChart Message (if you have MyChart) OR . A paper copy in the mail If you have any lab test that is abnormal or we need to change your treatment, we will call you to review the results.  Testing/Procedures: NONE  Follow-Up: 6 MONTHS with Dr Burt Knack or Richardson Dopp, PA At [facility], you and your health needs are our priority.  As part of our continuing mission to provide you with exceptional heart care, we have created designated Provider Care Teams.  These Care Teams include your primary Cardiologist (physician) and Advanced Practice Providers (APPs -  Physician Assistants and Nurse Practitioners) who all work together to provide you with the care you need, when you need it. Any Other Special Instructions Will Be Listed Below (If Applicable).

## 2019-05-11 LAB — CBC
Hematocrit: 30.9 % — ABNORMAL LOW (ref 34.0–46.6)
Hemoglobin: 10.4 g/dL — ABNORMAL LOW (ref 11.1–15.9)
MCH: 32.1 pg (ref 26.6–33.0)
MCHC: 33.7 g/dL (ref 31.5–35.7)
MCV: 95 fL (ref 79–97)
Platelets: 173 10*3/uL (ref 150–450)
RBC: 3.24 x10E6/uL — ABNORMAL LOW (ref 3.77–5.28)
RDW: 12.3 % (ref 11.7–15.4)
WBC: 6.3 10*3/uL (ref 3.4–10.8)

## 2019-05-11 LAB — BASIC METABOLIC PANEL
BUN/Creatinine Ratio: 15 (ref 12–28)
BUN: 36 mg/dL (ref 10–36)
CO2: 23 mmol/L (ref 20–29)
Calcium: 9.7 mg/dL (ref 8.7–10.3)
Chloride: 99 mmol/L (ref 96–106)
Creatinine, Ser: 2.4 mg/dL — ABNORMAL HIGH (ref 0.57–1.00)
GFR calc Af Amer: 19 mL/min/{1.73_m2} — ABNORMAL LOW (ref >59)
GFR calc non Af Amer: 16 mL/min/{1.73_m2} — ABNORMAL LOW (ref >59)
Glucose: 129 mg/dL — ABNORMAL HIGH (ref 65–99)
Potassium: 5 mmol/L (ref 3.5–5.2)
Sodium: 138 mmol/L (ref 134–144)

## 2019-05-11 LAB — PRO B NATRIURETIC PEPTIDE: NT-Pro BNP: 1880 pg/mL — ABNORMAL HIGH (ref 0–738)

## 2019-09-26 DIAGNOSIS — S233XXD Sprain of ligaments of thoracic spine, subsequent encounter: Secondary | ICD-10-CM | POA: Diagnosis not present

## 2019-09-26 DIAGNOSIS — M7918 Myalgia, other site: Secondary | ICD-10-CM | POA: Diagnosis not present

## 2019-09-26 DIAGNOSIS — M8008XA Age-related osteoporosis with current pathological fracture, vertebra(e), initial encounter for fracture: Secondary | ICD-10-CM | POA: Diagnosis not present

## 2019-11-20 ENCOUNTER — Other Ambulatory Visit: Payer: Self-pay | Admitting: Cardiovascular Disease

## 2019-11-28 DIAGNOSIS — S233XXD Sprain of ligaments of thoracic spine, subsequent encounter: Secondary | ICD-10-CM | POA: Diagnosis not present

## 2019-11-28 DIAGNOSIS — M7918 Myalgia, other site: Secondary | ICD-10-CM | POA: Diagnosis not present

## 2019-11-28 DIAGNOSIS — M546 Pain in thoracic spine: Secondary | ICD-10-CM | POA: Diagnosis not present

## 2019-12-01 ENCOUNTER — Other Ambulatory Visit: Payer: Self-pay | Admitting: Cardiovascular Disease

## 2019-12-25 ENCOUNTER — Other Ambulatory Visit: Payer: Self-pay | Admitting: Physician Assistant

## 2019-12-25 ENCOUNTER — Other Ambulatory Visit: Payer: Self-pay | Admitting: Cardiovascular Disease

## 2020-01-08 ENCOUNTER — Ambulatory Visit: Payer: Medicare Other | Admitting: Cardiovascular Disease

## 2020-01-08 ENCOUNTER — Other Ambulatory Visit: Payer: Self-pay

## 2020-01-08 ENCOUNTER — Encounter: Payer: Self-pay | Admitting: Cardiovascular Disease

## 2020-01-08 VITALS — BP 120/74 | HR 73 | Ht 61.5 in | Wt 107.2 lb

## 2020-01-08 DIAGNOSIS — E782 Mixed hyperlipidemia: Secondary | ICD-10-CM

## 2020-01-08 DIAGNOSIS — N1832 Chronic kidney disease, stage 3b: Secondary | ICD-10-CM | POA: Diagnosis not present

## 2020-01-08 DIAGNOSIS — I5022 Chronic systolic (congestive) heart failure: Secondary | ICD-10-CM | POA: Diagnosis not present

## 2020-01-08 DIAGNOSIS — I2510 Atherosclerotic heart disease of native coronary artery without angina pectoris: Secondary | ICD-10-CM

## 2020-01-08 DIAGNOSIS — I10 Essential (primary) hypertension: Secondary | ICD-10-CM

## 2020-01-08 NOTE — Patient Instructions (Signed)

## 2020-01-08 NOTE — Progress Notes (Signed)
Cardiology Office Note:    Date:  01/08/2020   ID:  Sophia Snyder, DOB 05-Mar-1919, MRN [id number]  PCP:  Angelina Sheriff, MD  Cardiologist:  Sherren Mocha, MD  Electrophysiologist:  None   Referring MD: Angelina Sheriff, MD   Chief Complaint  Patient presents with  . Coronary Artery Disease    History of Present Illness:    Sophia Snyder is a 86 y.o. female with a hx of:  Chronic Systolic CHF ? Ischemic CM ? ACEi DC'd in past due to increasing Creatinine  ? EF 20-25 >> 30-35 ? Echocardiogram 02/2019: EF 30-35, Gr 1 DD  Chronic kidney disease   Coronary artery disease  ? S/p Ant STEMI 5/17 >> DES x 2 to pLAD; RCA with CTO  Hypertension   Hyperlipidemia   Chronic kidney disease 3  The patient is here with her daughter today. She's been doing very well from a cardiac perspective. She has mild DOE which has been stable. She has some limitation from thoracic pain that radiates around the chest wall. Otherwise denies any substernal pain or pressure. No orthopnea, PND, or leg swelling. No bleeding problems and specifically denies hematuria or blood in her stool.   Past Medical History:  Diagnosis Date  . CAD (coronary artery disease), native coronary artery    chronic rt coroanry artery occlusion  . Essential hypertension 12/30/2015  . Hyperlipidemia LDL goal <70 12/30/2015  . S/P angioplasty with stent 12/28/15   DES to LAD  . ST elevation myocardial infarction (STEMI) of anterior wall ([place]) 12/28/2015    Past Surgical History:  Procedure Laterality Date  . CARDIAC CATHETERIZATION N/A 12/28/2015   Procedure: Left Heart Cath and Coronary Angiography;  Surgeon: Sherren Mocha, MD;  Location: [facility];  Service: Cardiovascular;  Laterality: N/A;  . CARDIAC CATHETERIZATION N/A 12/28/2015   Procedure: Coronary Stent Intervention;  Surgeon: Sherren Mocha, MD;  Location: [facility];  Service: Cardiovascular;  Laterality: N/A;    Current  Medications: Current Meds  Medication Sig  . atorvastatin (LIPITOR) 80 MG tablet TAKE 1 TABLET BY MOUTH DAILY AT 6 PM.  . clopidogrel (PLAVIX) 75 MG tablet TAKE 1 TABLET BY MOUTH DAILY.  . famotidine (PEPCID) 20 MG tablet 1TD  . furosemide (LASIX) 40 MG tablet Take 1 tablet (40 mg total) by mouth daily. Please keep up coming appt for further refills  . nebivolol (BYSTOLIC) 10 MG tablet TAKE 1/2 TABLET( 5 mg)  BY MOUTH TWICE A DAY.  . nitroGLYCERIN (NITROSTAT) 0.4 MG SL tablet Place 1 tablet (0.4 mg total) under the tongue every 5 (five) minutes x 3 doses as needed for chest pain.  Marland Kitchen OVER THE COUNTER MEDICATION Place 1 drop into both eyes daily as needed (allergies/ itching). Over the counter allergy eye drop  . OVER THE COUNTER MEDICATION Place 1-2 sprays into both nostrils at bedtime. Allergy nose spray  . traMADol (ULTRAM) 50 MG tablet Take 50 mg by mouth every 8 (eight) hours as needed for moderate pain or severe pain.   Marland Kitchen trolamine salicylate (ASPERCREME) 10 % cream Apply 1 application topically daily as needed for muscle pain (shoulder/knee pain).     Allergies:   Patient has no known allergies.   Social History   Socioeconomic History  . Marital status: Widowed    Spouse name: Not on file  . Number of children: Not on file  . Years of education: Not on file  . Highest education  level: Not on file  Occupational History  . Not on file  Tobacco Use  . Smoking status: Never Smoker  . Smokeless tobacco: Never Used  Substance and Sexual Activity  . Alcohol use: No  . Drug use: No  . Sexual activity: Never  Other Topics Concern  . Not on file  Social History Narrative  . Not on file   Social Determinants of Health   Financial Resource Strain:   . Difficulty of Paying Living Expenses:   Food Insecurity:   . Worried About Charity fundraiser in the Last Year:   . Arboriculturist in the Last Year:   Transportation Needs:   . Film/video editor (Medical):   Marland Kitchen Lack of  Transportation (Non-Medical):   Physical Activity:   . Days of Exercise per Week:   . Minutes of Exercise per Session:   Stress:   . Feeling of Stress :   Social Connections:   . Frequency of Communication with Friends and Family:   . Frequency of Social Gatherings with Friends and Family:   . Attends Religious Services:   . Active Member of Clubs or Organizations:   . Attends Archivist Meetings:   Marland Kitchen Marital Status:      Family History: The patient's family history includes Heart attack (age of onset: 35) in her father; Transient ischemic attack (age of onset: 52) in her mother.  ROS:   Please see the history of present illness.    All other systems reviewed and are negative.  EKGs/Labs/Other Studies Reviewed:    The following studies were reviewed today: Echo 02-27-2019: 1. The left ventricle has moderate-severely reduced systolic function,  with an ejection fraction of 30-35%. The cavity size was normal. There is  mildly increased left ventricular wall thickness. Left ventricular  diastolic Doppler parameters are  consistent with impaired relaxation.  2. LVEF is severely depressed with severe hypokinesis/akinesis of the  mid/distal septal, mid/distal inferior, distal anterior and apical walls   Probable no signidicant change from LVEF of 2017.  3. The right ventricle has normal systolic function.   EKG:  EKG is not ordered today.   Recent Labs: 05/10/2019: BUN 36; Creatinine, Ser 2.40; Hemoglobin 10.4; NT-Pro BNP 1,880; Platelets 173; Potassium 5.0; Sodium 138  Recent Lipid Panel    Component Value Date/Time   CHOL 144 05/04/2016 1139   TRIG 139 05/04/2016 1139   HDL 63 05/04/2016 1139   CHOLHDL 2.3 05/04/2016 1139   VLDL 28 05/04/2016 1139   LDLCALC 53 05/04/2016 1139    Physical Exam:    VS:  BP 120/74   Pulse 73   Ht 5' 1.5" (1.562 m)   Wt 107 lb 3.2 oz (48.6 kg)   SpO2 98%   BMI 19.93 kg/m     Wt Readings from Last 3 Encounters:   01/08/20 107 lb 3.2 oz (48.6 kg)  05/10/19 110 lb (49.9 kg)  10/25/18 116 lb (52.6 kg)     GEN: Well nourished, well developed elderly woman  in no acute distress HEENT: Normal NECK: No JVD; No carotid bruits LYMPHATICS: No lymphadenopathy CARDIAC: RRR, no murmurs, rubs, gallops RESPIRATORY:  Clear to auscultation without rales, wheezing or rhonchi  ABDOMEN: Soft, non-tender, non-distended MUSCULOSKELETAL:  No edema; No deformity  SKIN: Warm and dry NEUROLOGIC:  Alert and oriented x 3 PSYCHIATRIC:  Normal affect   ASSESSMENT:    1. Chronic systolic heart failure ([place])   2. Coronary artery disease involving  native coronary artery of native heart without angina pectoris   3. Essential hypertension   4. Mixed hyperlipidemia   5. Stage 3b chronic kidney disease    PLAN:    In order of problems listed above:  1. Exam is unremarkable and the patient has no evidence of volume overload.  She continues on furosemide, hydralazine, and nebivolol.  No medication changes are recommended today.  She is not a candidate for ACE/ARB/ARNI because of advanced kidney disease. 2. No symptoms of angina.  Continue clopidogrel and atorvastatin. 3. Blood pressure is well controlled on current medical therapy 4. Treated with a high intensity statin drug.  LDL cholesterol 47 mg/dL. 5. Most recent labs reviewed with creatinine 2.4 mg/dL, this is stable from previous values over the last 12 to 24 months.  Medication Adjustments/Labs and Tests Ordered: Current medicines are reviewed at length with the patient today.  Concerns regarding medicines are outlined above.  No orders of the defined types were placed in this encounter.  No orders of the defined types were placed in this encounter.   Patient Instructions  Medication Instructions:  Your provider recommends that you continue on your current medications as directed. Please refer to the Current Medication list given to you today.   *If you need a  refill on your cardiac medications before your next appointment, please call your pharmacy*   Follow-Up: At [facility], you and your health needs are our priority.  As part of our continuing mission to provide you with exceptional heart care, we have created designated Provider Care Teams.  These Care Teams include your primary Cardiologist (physician) and Advanced Practice Providers (APPs -  Physician Assistants and Nurse Practitioners) who all work together to provide you with the care you need, when you need it. Your next appointment:   12 month(s) The format for your next appointment:   In Person Provider:   You may see Sherren Mocha, MD or one of the following Advanced Practice Providers on your designated Care Team:    Richardson Dopp, PA-C  Robbie Lis, [place]      Signed, Sherren Mocha, MD  01/08/2020 9:38 AM    [place]

## 2020-02-13 DIAGNOSIS — S233XXD Sprain of ligaments of thoracic spine, subsequent encounter: Secondary | ICD-10-CM | POA: Diagnosis not present

## 2020-02-13 DIAGNOSIS — M7918 Myalgia, other site: Secondary | ICD-10-CM | POA: Diagnosis not present

## 2020-02-13 DIAGNOSIS — M546 Pain in thoracic spine: Secondary | ICD-10-CM | POA: Diagnosis not present

## 2020-02-14 ENCOUNTER — Other Ambulatory Visit: Payer: Self-pay | Admitting: Cardiovascular Disease

## 2020-04-02 ENCOUNTER — Other Ambulatory Visit: Payer: Self-pay | Admitting: Cardiovascular Disease

## 2020-04-10 DIAGNOSIS — L570 Actinic keratosis: Secondary | ICD-10-CM | POA: Diagnosis not present

## 2020-04-10 DIAGNOSIS — L578 Other skin changes due to chronic exposure to nonionizing radiation: Secondary | ICD-10-CM | POA: Diagnosis not present

## 2020-04-10 DIAGNOSIS — L814 Other melanin hyperpigmentation: Secondary | ICD-10-CM | POA: Diagnosis not present

## 2020-04-10 DIAGNOSIS — C44319 Basal cell carcinoma of skin of other parts of face: Secondary | ICD-10-CM | POA: Diagnosis not present

## 2020-05-03 DIAGNOSIS — S233XXD Sprain of ligaments of thoracic spine, subsequent encounter: Secondary | ICD-10-CM | POA: Diagnosis not present

## 2020-05-03 DIAGNOSIS — M7918 Myalgia, other site: Secondary | ICD-10-CM | POA: Diagnosis not present

## 2020-05-03 DIAGNOSIS — M546 Pain in thoracic spine: Secondary | ICD-10-CM | POA: Diagnosis not present

## 2020-05-15 DIAGNOSIS — D0439 Carcinoma in situ of skin of other parts of face: Secondary | ICD-10-CM | POA: Diagnosis not present

## 2020-06-04 ENCOUNTER — Other Ambulatory Visit: Payer: Self-pay | Admitting: Physician Assistant

## 2020-06-19 ENCOUNTER — Other Ambulatory Visit: Payer: Self-pay

## 2020-06-19 MED ORDER — HYDRALAZINE HCL 10 MG PO TABS: 10.0000 mg | ORAL_TABLET | Freq: Three times a day (TID) | ORAL | 1 refills | Status: DC

## 2020-06-19 NOTE — Telephone Encounter (Signed)
Pt's medication was sent to pt's pharmacy as requested. Confirmation received.  °

## 2020-06-25 DIAGNOSIS — Z23 Encounter for immunization: Secondary | ICD-10-CM | POA: Diagnosis not present

## 2020-07-30 DIAGNOSIS — M7918 Myalgia, other site: Secondary | ICD-10-CM | POA: Diagnosis not present

## 2020-07-30 DIAGNOSIS — M546 Pain in thoracic spine: Secondary | ICD-10-CM | POA: Diagnosis not present

## 2020-10-29 DIAGNOSIS — M546 Pain in thoracic spine: Secondary | ICD-10-CM | POA: Diagnosis not present

## 2020-10-29 DIAGNOSIS — M7918 Myalgia, other site: Secondary | ICD-10-CM | POA: Diagnosis not present

## 2020-11-20 ENCOUNTER — Other Ambulatory Visit: Payer: Self-pay | Admitting: Cardiovascular Disease

## 2020-12-10 DIAGNOSIS — D0439 Carcinoma in situ of skin of other parts of face: Secondary | ICD-10-CM | POA: Diagnosis not present

## 2020-12-27 ENCOUNTER — Other Ambulatory Visit: Payer: Self-pay | Admitting: Cardiovascular Disease

## 2021-01-15 ENCOUNTER — Encounter: Payer: Self-pay | Admitting: Physician Assistant

## 2021-01-15 ENCOUNTER — Other Ambulatory Visit: Payer: Self-pay

## 2021-01-15 ENCOUNTER — Ambulatory Visit: Payer: Medicare Other | Admitting: Physician Assistant

## 2021-01-15 VITALS — BP 120/68 | HR 80 | Ht 61.5 in | Wt 105.4 lb

## 2021-01-15 DIAGNOSIS — I5020 Unspecified systolic (congestive) heart failure: Secondary | ICD-10-CM | POA: Diagnosis not present

## 2021-01-15 DIAGNOSIS — E782 Mixed hyperlipidemia: Secondary | ICD-10-CM

## 2021-01-15 DIAGNOSIS — I2510 Atherosclerotic heart disease of native coronary artery without angina pectoris: Secondary | ICD-10-CM | POA: Diagnosis not present

## 2021-01-15 DIAGNOSIS — N1832 Chronic kidney disease, stage 3b: Secondary | ICD-10-CM

## 2021-01-15 DIAGNOSIS — I10 Essential (primary) hypertension: Secondary | ICD-10-CM | POA: Diagnosis not present

## 2021-01-15 DIAGNOSIS — I5022 Chronic systolic (congestive) heart failure: Secondary | ICD-10-CM | POA: Diagnosis not present

## 2021-01-15 NOTE — Patient Instructions (Signed)
Medication Instructions:  Your physician recommends that you continue on your current medications as directed. Please refer to the Current Medication list given to you today.  *If you need a refill on your cardiac medications before your next appointment, please call your pharmacy*   Lab Work: TODAY!!!!!  CMET   If you have labs (blood work) drawn today and your tests are completely normal, you will receive your results only by: Marland Kitchen MyChart Message (if you have MyChart) OR . A paper copy in the mail If you have any lab test that is abnormal or we need to change your treatment, we will call you to review the results.   Testing/Procedures: -NONE   Follow-Up: At [facility], you and your health needs are our priority.  As part of our continuing mission to provide you with exceptional heart care, we have created designated Provider Care Teams.  These Care Teams include your primary Cardiologist (physician) and Advanced Practice Providers (APPs -  Physician Assistants and Nurse Practitioners) who all work together to provide you with the care you need, when you need it.  We recommend signing up for the patient portal called "MyChart".  Sign up information is provided on this After Visit Summary.  MyChart is used to connect with patients for Virtual Visits (Telemedicine).  Patients are able to view lab/test results, encounter notes, upcoming appointments, etc.  Non-urgent messages can be sent to your provider as well.   To learn more about what you can do with MyChart, go to [email or web address].    Your next appointment:   1 year(s)  The format for your next appointment:   In Person  Provider:   You may see Sherren Mocha, MD or one of the following Advanced Practice Providers on your designated Care Team:    Richardson Dopp, PA-C   Other Instructions Your physician wants you to follow-up in: 1 year with Dr. Burt Knack or Richardson Dopp, PA.  You will receive a reminder letter in the  mail two months in advance. If you don't receive a letter, please call our office to schedule the follow-up appointment.   Dr. Reinaldo Meeker @ [phone]      [address]      [place], [place] [address] For a new PCP.

## 2021-01-15 NOTE — Progress Notes (Signed)
Cardiology Office Note:    Date:  01/15/2021   ID:  Sophia Snyder, DOB August 04, 1919, MRN [id number]  PCP:  Sophia Sheriff, MD   [facility] Providers Cardiologist:  Sophia Mocha, MD Cardiology APP:  Sophia Snyder     Referring MD: Sophia Sheriff, MD   Chief Complaint:  Follow-up (CAD)    Patient Profile:    Sophia Snyder is a 87 y.o. female with:   Chronic Systolic CHF ? Ischemic CM ? ACEi DC'd in past due to increasing Creatinine (no ACE/ARB/ARNI/MRA) ? EF 20-25 >> 30-35 ? Echocardiogram 02/2019: EF 30-35, Gr 1 DD  Chronic kidney disease   Coronary artery disease  ? S/p Ant STEMI 5/17 >> DES x 2 to pLAD; RCA with CTO  Hypertension   Hyperlipidemia   Chronic kidney disease 3   Prior CV studies:  Echocardiogram 02/27/2019 EF 30-35, mild LVH, Gr 1 DD, inf and ant severe HK  Limited Echo 5/17 EF 30-35, diff HK and apical, anterior, anteroseptal, inferoseptal AK, No LV clot  Echo 5/17 Mod conc LVH, EF 30-35, anterior and anteroseptal AK and DK of apical septum and true apex, restrictive physio, mild TR, PASP 35  LHC 5/17 LAD ostial 100 LCx irregs RCA mid 100 with L-R collats (CTO) EF 20-25, 2+ MR PCI: 2.5 x 32 mm Synergy DES and 3 x 28 mm Synergy DES to LAD   History of Present Illness: Ms. Sophia Snyder was last seen in 5/21 by Dr. Burt Snyder.  She returns for f/u.  She is here with her daughter.  She has been doing well without chest pain, shortness of breath, syncope, orthopnea or leg edema.  She continues to struggle with back pain.  She needs a new primary care doctor.       Past Medical History:  Diagnosis Date  . CAD (coronary artery disease), native coronary artery    chronic rt coroanry artery occlusion  . Essential hypertension 12/30/2015  . Hyperlipidemia LDL goal <70 12/30/2015  . S/P angioplasty with stent 12/28/15   DES to LAD  . ST elevation myocardial infarction (STEMI) of anterior wall ([place]) 12/28/2015    Current  Medications: Current Meds  Medication Sig  . atorvastatin (LIPITOR) 80 MG tablet TAKE 1 TABLET BY MOUTH DAILY AT 6 PM.  . clopidogrel (PLAVIX) 75 MG tablet TAKE 1 TABLET BY MOUTH DAILY.  . famotidine (PEPCID) 20 MG tablet TAKE 1 TABLET BY MOUTH DAILY  . furosemide (LASIX) 40 MG tablet TAKE 1 TABLET BY MOUTH DAILY  . hydrALAZINE (APRESOLINE) 10 MG tablet TAKE 1 TABLET BY MOUTH THREE TIMES A DAY.  . nebivolol (BYSTOLIC) 10 MG tablet TAKE 1/2 TABLET BY MOUTH TWICE A DAY.  . nitroGLYCERIN (NITROSTAT) 0.4 MG SL tablet Place 1 tablet (0.4 mg total) under the tongue every 5 (five) minutes x 3 doses as needed for chest pain.  Marland Kitchen OVER THE COUNTER MEDICATION Place 1-2 sprays into both nostrils at bedtime. Allergy nose spray  . traMADol (ULTRAM) 50 MG tablet Take 50 mg by mouth daily.  Marland Kitchen trolamine salicylate (ASPERCREME) 10 % cream Apply 1 application topically daily as needed for muscle pain (shoulder/knee pain).     Allergies:   Patient has no known allergies.   Social History   Tobacco Use  . Smoking status: Never Smoker  . Smokeless tobacco: Never Used  Vaping Use  . Vaping Use: Never used  Substance Use Topics  . Alcohol use: No  . Drug  use: No     Family Hx: The patient's family history includes Heart attack (age of onset: 34) in her father; Transient ischemic attack (age of onset: 79) in her mother.  Review of Systems  Gastrointestinal: Negative for hematochezia.  Genitourinary: Negative for hematuria.     EKGs/Labs/Other Test Reviewed:    EKG:  EKG is  ordered today.  The ekg ordered today demonstrates normal sinus rhythm, HR 80, normal axis, nonspecific ST-T wave changes, QTC 452  Recent Labs: No results found for requested labs within last 8760 hours.   Recent Lipid Panel Lab Results  Component Value Date/Time   CHOL 144 05/04/2016 11:39 AM   TRIG 139 05/04/2016 11:39 AM   HDL 63 05/04/2016 11:39 AM   LDLCALC 53 05/04/2016 11:39 AM      Risk  Assessment/Calculations:      Physical Exam:    VS:  BP 120/68   Pulse 80   Ht 5' 1.5" (1.562 m)   Wt 105 lb 6.4 oz (47.8 kg)   SpO2 95%   BMI 19.59 kg/m     Wt Readings from Last 3 Encounters:  01/15/21 105 lb 6.4 oz (47.8 kg)  01/08/20 107 lb 3.2 oz (48.6 kg)  05/10/19 110 lb (49.9 kg)     Constitutional:      Appearance: Healthy appearance. Not in distress.  Neck:     Vascular: JVD normal.  Pulmonary:     Effort: Pulmonary effort is normal.     Breath sounds: No wheezing. No rales.  Cardiovascular:     Normal rate. Regular rhythm. Normal S1. Normal S2.     Murmurs: There is no murmur.  Edema:    Peripheral edema absent.  Abdominal:     Palpations: Abdomen is soft.  Skin:    General: Skin is warm and dry.  Neurological:     Mental Status: Alert and oriented to person, place and time.     Cranial Nerves: Cranial nerves are intact.        ASSESSMENT & PLAN:    1. Coronary artery disease involving native coronary artery of native heart without angina pectoris Hx of Ant MI in 2017 tx with DES x 2 to LAD. The RCA is chronically occluded.  She is doing well without anginal symptoms.  She is tolerating her medications.  Continue current dose of clopidogrel, atorvastatin, nebivolol.  2. (HFrEF) heart failure with reduced ejection fraction  EF 30-35 by echocardiogram in 02/2019.  She is limited mainly by her back pain.  NYHA IIb-III.  Volume status appears stable.  She is not on ACE inhibitor/ARB/ARN I secondary to renal insufficiency.  Continue current dose of hydralazine, nebivolol.  Continue current dose of furosemide.  Obtain follow-up CMET.  3. Essential hypertension The patient's blood pressure is controlled on her current regimen.  Continue current therapy.   4. Stage 3b chronic kidney disease ([place]) Last creatinine on file was in 04/2019.  It was 2.4 at that time.  She does not see a nephrologist.  She has not seen primary care in quite some time.  Obtain follow-up  BMET today.  5. Mixed hyperlipidemia Continue high intensity statin therapy.  Obtain follow-up CMET today.     Dispo:  Return in about 1 year (around 01/15/2022) for Routine follow up in 1 year with Sophia Snyder or Sophia Dopp, PA. .   Medication Adjustments/Labs and Tests Ordered: Current medicines are reviewed at length with the patient today.  Concerns regarding medicines are outlined above.  Tests Ordered: Orders Placed This Encounter  Procedures  . Comp Met (CMET)  . Ambulatory referral to [facility]  . EKG 12-Lead   Medication Changes: No orders of the defined types were placed in this encounter.   Signed, Sophia Dopp, PA-C  01/15/2021 4:32 PM    [facility] [place], [place], [place]  [address] Phone: [phone]; Fax: [phone]

## 2021-01-16 LAB — COMPREHENSIVE METABOLIC PANEL
ALT: 15 IU/L (ref 0–32)
AST: 45 IU/L — ABNORMAL HIGH (ref 0–40)
Albumin/Globulin Ratio: 1.5 (ref 1.2–2.2)
Albumin: 4.1 g/dL (ref 3.5–4.6)
Alkaline Phosphatase: 97 IU/L (ref 44–121)
BUN/Creatinine Ratio: 14 (ref 12–28)
BUN: 32 mg/dL (ref 10–36)
Bilirubin Total: 0.5 mg/dL (ref 0.0–1.2)
CO2: 24 mmol/L (ref 20–29)
Calcium: 9.2 mg/dL (ref 8.7–10.3)
Chloride: 98 mmol/L (ref 96–106)
Creatinine, Ser: 2.3 mg/dL — ABNORMAL HIGH (ref 0.57–1.00)
Globulin, Total: 2.7 g/dL (ref 1.5–4.5)
Glucose: 107 mg/dL — ABNORMAL HIGH (ref 65–99)
Potassium: 4.2 mmol/L (ref 3.5–5.2)
Sodium: 139 mmol/L (ref 134–144)
Total Protein: 6.8 g/dL (ref 6.0–8.5)
eGFR: 19 mL/min/{1.73_m2} — ABNORMAL LOW (ref >59)

## 2021-01-28 DIAGNOSIS — M546 Pain in thoracic spine: Secondary | ICD-10-CM | POA: Diagnosis not present

## 2021-01-28 DIAGNOSIS — M8008XD Age-related osteoporosis with current pathological fracture, vertebra(e), subsequent encounter for fracture with routine healing: Secondary | ICD-10-CM | POA: Diagnosis not present

## 2021-01-28 DIAGNOSIS — M7918 Myalgia, other site: Secondary | ICD-10-CM | POA: Diagnosis not present

## 2021-01-29 ENCOUNTER — Other Ambulatory Visit: Payer: Self-pay | Admitting: Cardiovascular Disease

## 2021-03-24 ENCOUNTER — Other Ambulatory Visit: Payer: Self-pay | Admitting: Physician Assistant

## 2021-04-24 ENCOUNTER — Other Ambulatory Visit: Payer: Self-pay | Admitting: Cardiovascular Disease

## 2021-04-24 NOTE — Telephone Encounter (Signed)
Pt's pharmacy is requesting a refill on famotidine (Pepcid). Would Dr. Burt Knack like to refill this medication? Please address

## 2021-04-29 DIAGNOSIS — M546 Pain in thoracic spine: Secondary | ICD-10-CM | POA: Diagnosis not present

## 2021-04-29 DIAGNOSIS — M7918 Myalgia, other site: Secondary | ICD-10-CM | POA: Diagnosis not present

## 2021-04-29 DIAGNOSIS — M8008XD Age-related osteoporosis with current pathological fracture, vertebra(e), subsequent encounter for fracture with routine healing: Secondary | ICD-10-CM | POA: Diagnosis not present

## 2021-05-06 ENCOUNTER — Ambulatory Visit (INDEPENDENT_AMBULATORY_CARE_PROVIDER_SITE_OTHER): Payer: Medicare Other | Admitting: Legal Medicine

## 2021-05-06 ENCOUNTER — Encounter: Payer: Self-pay | Admitting: Legal Medicine

## 2021-05-06 ENCOUNTER — Other Ambulatory Visit: Payer: Self-pay

## 2021-05-06 VITALS — BP 120/80 | HR 76 | Temp 97.5°F | Ht 62.0 in | Wt 101.2 lb

## 2021-05-06 DIAGNOSIS — M419 Scoliosis, unspecified: Secondary | ICD-10-CM | POA: Insufficient documentation

## 2021-05-06 DIAGNOSIS — I5022 Chronic systolic (congestive) heart failure: Secondary | ICD-10-CM

## 2021-05-06 DIAGNOSIS — E785 Hyperlipidemia, unspecified: Secondary | ICD-10-CM

## 2021-05-06 DIAGNOSIS — I10 Essential (primary) hypertension: Secondary | ICD-10-CM | POA: Diagnosis not present

## 2021-05-06 DIAGNOSIS — M1990 Unspecified osteoarthritis, unspecified site: Secondary | ICD-10-CM | POA: Insufficient documentation

## 2021-05-06 DIAGNOSIS — Z681 Body mass index (BMI) 19 or less, adult: Secondary | ICD-10-CM | POA: Insufficient documentation

## 2021-05-06 DIAGNOSIS — N184 Chronic kidney disease, stage 4 (severe): Secondary | ICD-10-CM | POA: Insufficient documentation

## 2021-05-06 DIAGNOSIS — Z23 Encounter for immunization: Secondary | ICD-10-CM

## 2021-05-06 DIAGNOSIS — E441 Mild protein-calorie malnutrition: Secondary | ICD-10-CM

## 2021-05-06 HISTORY — DX: Chronic kidney disease, stage 4 (severe): N18.4

## 2021-05-06 HISTORY — DX: Mild protein-calorie malnutrition: E44.1

## 2021-05-06 HISTORY — DX: Body mass index (BMI) 19.9 or less, adult: Z68.1

## 2021-05-06 HISTORY — DX: Scoliosis, unspecified: M41.9

## 2021-05-06 NOTE — Assessment & Plan Note (Signed)
EF 30 - 35%

## 2021-05-06 NOTE — Progress Notes (Signed)
New Patient Office Visit  Subjective:  Patient ID: Sophia Snyder, female    DOB: 05-18-1919  Age: 87 y.o. MRN: [id number]  CC:  Chief Complaint  Patient presents with   Establish Care    HPI Sophia Snyder presents for establish care.  Patient presents for follow up of hypertension.  Patient tolerating hydralazine well with side effects.  Patient was diagnosed with hypertension 2010 so has been treated for hypertension for 10 years.Patient is working on maintaining diet and exercise regimen and follows up as directed. Complication include none.   Patient presents with hyperlipidemia.  Compliance with treatment has been good; patient takes medicines as directed, maintains low cholesterol diet, follows up as directed, and maintains exercise regimen.  Patient is using atorvastin without problems.   Patient presents with HFrEF  that is stable. Diagnosis made 4years.  The course of the disease is stable.  Current medicines include nebivolol, hydralazine. Patient follows a low cholesterol diet and maintains a weight diary.  Patient is on low salt, low cholesterol diet and avoids alcohol.  Patient denies adverse effects of medicines. Patient is monitoring weight and has no chnage of weight.  Patient is having no pedal edema, no PND and no PND.  Patient is continuing to see cardiology.   She has chronic back pain.  Sees doctor in [place].  For kyphosis and gets injections.  Ast one last week.  She is active full ADLs, full IADLs.  Past Medical History:  Diagnosis Date   Arthritis    CAD (coronary artery disease), native coronary artery    chronic rt coroanry artery occlusion   Essential hypertension 12/30/2015   GERD (gastroesophageal reflux disease)    Hyperlipidemia LDL goal <70 12/30/2015   S/P angioplasty with stent 12/28/2015   DES to LAD   ST elevation myocardial infarction (STEMI) of anterior wall ([place]) 12/28/2015    Past Surgical History:  Procedure Laterality Date   BALLOON  ANGIOPLASTY, ARTERY     75 or 87 years old   CARDIAC CATHETERIZATION N/A 12/28/2015   Procedure: Left Heart Cath and Coronary Angiography;  Surgeon: Sherren Mocha, MD;  Location: [facility];  Service: Cardiovascular;  Laterality: N/A;   CARDIAC CATHETERIZATION N/A 12/28/2015   Procedure: Coronary Stent Intervention;  Surgeon: Sherren Mocha, MD;  Location: [facility];  Service: Cardiovascular;  Laterality: N/A;    Family History  Problem Relation Age of Onset   Transient ischemic attack Mother 65   Heart attack Father 9    Social History   Socioeconomic History   Marital status: Widowed    Spouse name: Not on file   Number of children: 2   Years of education: Not on file   Highest education level: Not on file  Occupational History   Not on file  Tobacco Use   Smoking status: Never   Smokeless tobacco: Never  Vaping Use   Vaping Use: Never used  Substance and Sexual Activity   Alcohol use: No   Drug use: No   Sexual activity: Never  Other Topics Concern   Not on file  Social History Narrative   Not on file   Social Determinants of Health   Financial Resource Strain: Not on file  Food Insecurity: Not on file  Transportation Needs: Not on file  Physical Activity: Not on file  Stress: Not on file  Social Connections: Not on file  Intimate Partner Violence: Not on file    ROS Review  of Systems  Constitutional:  Negative for activity change and appetite change.  HENT:  Negative for congestion.   Eyes:  Negative for visual disturbance.  Respiratory:  Negative for chest tightness and shortness of breath.   Cardiovascular:  Negative for chest pain and palpitations.  Gastrointestinal:  Negative for abdominal distention and abdominal pain.  Endocrine: Negative for polyuria.  Genitourinary:  Negative for difficulty urinating, dysuria and urgency.  Musculoskeletal:  Negative for arthralgias and back pain.  Skin: Negative.   Neurological: Negative.    Psychiatric/Behavioral: Negative.     Objective:   Today's Vitals: BP 120/80   Pulse 76   Temp (!) 97.5 F (36.4 C)   Ht 5\' 2"  (1.575 m)   Wt 101 lb 3.2 oz (45.9 kg)   SpO2 98%   BMI 18.51 kg/m   Physical Exam Vitals reviewed.  Constitutional:      General: She is not in acute distress.    Appearance: Normal appearance.  HENT:     Head: Normocephalic.     Right Ear: Tympanic membrane, ear canal and external ear normal.     Left Ear: Tympanic membrane, ear canal and external ear normal.     Mouth/Throat:     Mouth: Mucous membranes are moist.  Eyes:     Extraocular Movements: Extraocular movements intact.     Conjunctiva/sclera: Conjunctivae normal.     Pupils: Pupils are equal, round, and reactive to light.  Cardiovascular:     Rate and Rhythm: Normal rate and regular rhythm.     Pulses: Normal pulses.     Heart sounds: Normal heart sounds. No murmur heard.   No gallop.  Pulmonary:     Effort: Pulmonary effort is normal. No respiratory distress.     Breath sounds: Normal breath sounds. No wheezing.  Abdominal:     General: Abdomen is flat. Bowel sounds are normal. There is no distension.     Tenderness: There is no abdominal tenderness.  Musculoskeletal:     Cervical back: Normal range of motion and neck supple.     Right lower leg: No edema.     Left lower leg: No edema.     Comments: kyphoscoliosis  Skin:    General: Skin is warm.     Capillary Refill: Capillary refill takes less than 2 seconds.  Neurological:     General: No focal deficit present.     Mental Status: She is alert and oriented to person, place, and time. Mental status is at baseline.     Gait: Gait normal.     Deep Tendon Reflexes: Reflexes normal.    Assessment & Plan:  Diagnoses and all orders for this visit: Essential hypertension -     CBC with Differential/Platelet -     Comprehensive metabolic panel An individual hypertension care plan was established and reinforced today.  The  patient's status was assessed using clinical findings on exam and labs or diagnostic tests. The patient's success at meeting treatment goals on disease specific evidence-based guidelines and found to be well controlled. SELF MANAGEMENT: The patient and I together assessed ways to personally work towards obtaining the recommended goals. RECOMMENDATIONS: avoid decongestants found in common cold remedies, decrease consumption of alcohol, perform routine monitoring of BP with home BP cuff, exercise, reduction of dietary salt, take medicines as prescribed, try not to miss doses and quit smoking.  Regular exercise and maintaining a healthy weight is needed.  Stress reduction may help. A CLINICAL SUMMARY including written plan  identify barriers to care unique to individual due to social or financial issues.  We attempt to mutually creat solutions for individual and family understanding.   Chronic systolic heart failure [facility]) An individualized care plan was established and reinforced.  The patient's disease status was assessed using clinical finding son exam today, labs, and/or other diagnostic testing such as x-rays, to determine the patient's success in meeting treatmentgoalsbased on disease-based guidelines and found to beimproving. But not at goal yet. Medications prescriptions no changes Laboratory tests ordered to be performed today include routine. RECOMMENDATIONS: given include see cardiology.  Call physician is patient gains 3 lbs in one day or 5 lbs for one week.  Call for progressive PND, orthopnea or increased pedal edema.  Adult BMI <19 kg/sq m Supplement nutrition with protein/calorie supplement with meals to improve nutritional status.   Malnutrition of mild degree (HCC) Supplement nutrition with protein/calorie supplement with meals to improve nutritional status.   Hyperlipidemia LDL goal <70 -     Lipid panel AN INDIVIDUAL CARE PLAN for hyperlipidemia/ cholesterol was established and  reinforced today.  The patient's status was assessed using clinical findings on exam, lab and other diagnostic tests. The patient's disease status was assessed based on evidence-based guidelines and found to be well controlled. MEDICATIONS were reviewed. SELF MANAGEMENT GOALS have been discussed and patient's success at attaining the goal of low cholesterol was assessed. RECOMMENDATION given include regular exercise 3 days a week and low cholesterol/low fat diet. CLINICAL SUMMARY including written plan to identify barriers unique to the patient due to social or economic  reasons was discussed.  Need for vaccination -     Flu Vaccine QUAD High Dose(Fluad) Chronic kidney disease, stage 4 (severe) (HCC) AN INDIVIDUAL CARE PLAN CRF was established and reinforced today.  The patient's status was assessed using clinical findings on exam, labs, and other diagnostic testing. Patient's success at meeting treatment goals based on disease specific evidence-bassed guidelines and found to be in poorly control. RECOMMENDATIONS include recommending nephrology for advanced rena disease, patient refuses.  Kyphoscoliosis  Chronic curvature of back, I do not know if Dexa showed osteoporosis but this is the cause of back pain, braces have not worked, she see doctor in [place]    Outpatient Encounter Medications as of 05/06/2021  Medication Sig   atorvastatin (LIPITOR) 80 MG tablet TAKE 1 TABLET BY MOUTH DAILY AT 6 PM.   clopidogrel (PLAVIX) 75 MG tablet TAKE 1 TABLET BY MOUTH DAILY   famotidine (PEPCID) 20 MG tablet TAKE 1 TABLET BY MOUTH DAILY   furosemide (LASIX) 40 MG tablet TAKE 1 TABLET BY MOUTH DAILY   hydrALAZINE (APRESOLINE) 10 MG tablet TAKE 1 TABLET BY MOUTH THREE TIMES A DAY.   nebivolol (BYSTOLIC) 10 MG tablet TAKE 1/2 A TABLET BY MOUTH TWICE A DAY   nitroGLYCERIN (NITROSTAT) 0.4 MG SL tablet Place 1 tablet (0.4 mg total) under the tongue every 5 (five) minutes x 3 doses as needed for chest pain.    OVER THE COUNTER MEDICATION Place 1-2 sprays into both nostrils at bedtime. Allergy nose spray   traMADol (ULTRAM) 50 MG tablet Take 50 mg by mouth daily.   trolamine salicylate (ASPERCREME) 10 % cream Apply 1 application topically daily as needed for muscle pain (shoulder/knee pain).   No facility-administered encounter medications on file as of 05/06/2021.    Follow-up: Return in about 6 months (around 11/03/2021) for fasting.   Reinaldo Meeker, MD

## 2021-05-07 ENCOUNTER — Other Ambulatory Visit: Payer: Self-pay | Admitting: Legal Medicine

## 2021-05-07 DIAGNOSIS — N184 Chronic kidney disease, stage 4 (severe): Secondary | ICD-10-CM

## 2021-05-07 LAB — CBC WITH DIFFERENTIAL/PLATELET
Basophils Absolute: 0 10*3/uL (ref 0.0–0.2)
Basos: 0 %
EOS (ABSOLUTE): 0 10*3/uL (ref 0.0–0.4)
Eos: 0 %
Hematocrit: 32.8 % — ABNORMAL LOW (ref 34.0–46.6)
Hemoglobin: 11.1 g/dL (ref 11.1–15.9)
Immature Grans (Abs): 0 10*3/uL (ref 0.0–0.1)
Immature Granulocytes: 0 %
Lymphocytes Absolute: 1.8 10*3/uL (ref 0.7–3.1)
Lymphs: 22 %
MCH: 32.5 pg (ref 26.6–33.0)
MCHC: 33.8 g/dL (ref 31.5–35.7)
MCV: 96 fL (ref 79–97)
Monocytes Absolute: 0.7 10*3/uL (ref 0.1–0.9)
Monocytes: 9 %
Neutrophils Absolute: 5.5 10*3/uL (ref 1.4–7.0)
Neutrophils: 69 %
Platelets: 216 10*3/uL (ref 150–450)
RBC: 3.42 x10E6/uL — ABNORMAL LOW (ref 3.77–5.28)
RDW: 13 % (ref 11.7–15.4)
WBC: 8.1 10*3/uL (ref 3.4–10.8)

## 2021-05-07 LAB — COMPREHENSIVE METABOLIC PANEL
ALT: 16 IU/L (ref 0–32)
AST: 40 IU/L (ref 0–40)
Albumin/Globulin Ratio: 2.2 (ref 1.2–2.2)
Albumin: 4.8 g/dL — ABNORMAL HIGH (ref 3.5–4.6)
Alkaline Phosphatase: 89 IU/L (ref 44–121)
BUN/Creatinine Ratio: 18 (ref 12–28)
BUN: 48 mg/dL — ABNORMAL HIGH (ref 10–36)
Bilirubin Total: 0.4 mg/dL (ref 0.0–1.2)
CO2: 22 mmol/L (ref 20–29)
Calcium: 9.9 mg/dL (ref 8.7–10.3)
Chloride: 99 mmol/L (ref 96–106)
Creatinine, Ser: 2.66 mg/dL — ABNORMAL HIGH (ref 0.57–1.00)
Globulin, Total: 2.2 g/dL (ref 1.5–4.5)
Glucose: 113 mg/dL — ABNORMAL HIGH (ref 65–99)
Potassium: 4.8 mmol/L (ref 3.5–5.2)
Sodium: 138 mmol/L (ref 134–144)
Total Protein: 7 g/dL (ref 6.0–8.5)
eGFR: 16 mL/min/{1.73_m2} — ABNORMAL LOW (ref >59)

## 2021-05-07 LAB — LIPID PANEL
Chol/HDL Ratio: 2.4 ratio (ref 0.0–4.4)
Cholesterol, Total: 176 mg/dL (ref 100–199)
HDL: 73 mg/dL (ref >39)
LDL Chol Calc (NIH): 88 mg/dL (ref 0–99)
Triglycerides: 81 mg/dL (ref 0–149)
VLDL Cholesterol Cal: 15 mg/dL (ref 5–40)

## 2021-05-07 LAB — CARDIOVASCULAR RISK ASSESSMENT

## 2021-05-07 NOTE — Progress Notes (Signed)
Mild anemia of chronic disease, glucose 113, kidneys are stage 4 severely decreased function- I still recommend urology consult, liver tests normal, cholesterol normal,  lp

## 2021-06-03 ENCOUNTER — Other Ambulatory Visit: Payer: Self-pay | Admitting: Cardiovascular Disease

## 2021-07-09 DIAGNOSIS — N184 Chronic kidney disease, stage 4 (severe): Secondary | ICD-10-CM | POA: Diagnosis not present

## 2021-07-09 DIAGNOSIS — E785 Hyperlipidemia, unspecified: Secondary | ICD-10-CM | POA: Diagnosis not present

## 2021-07-09 DIAGNOSIS — I5020 Unspecified systolic (congestive) heart failure: Secondary | ICD-10-CM | POA: Diagnosis not present

## 2021-07-09 DIAGNOSIS — I2510 Atherosclerotic heart disease of native coronary artery without angina pectoris: Secondary | ICD-10-CM | POA: Diagnosis not present

## 2021-07-09 DIAGNOSIS — M549 Dorsalgia, unspecified: Secondary | ICD-10-CM | POA: Diagnosis not present

## 2021-07-18 ENCOUNTER — Telehealth: Payer: Self-pay | Admitting: Cardiovascular Disease

## 2021-07-18 DIAGNOSIS — I700 Atherosclerosis of aorta: Secondary | ICD-10-CM | POA: Diagnosis not present

## 2021-07-18 DIAGNOSIS — M545 Low back pain, unspecified: Secondary | ICD-10-CM | POA: Diagnosis not present

## 2021-07-18 DIAGNOSIS — I129 Hypertensive chronic kidney disease with stage 1 through stage 4 chronic kidney disease, or unspecified chronic kidney disease: Secondary | ICD-10-CM | POA: Diagnosis not present

## 2021-07-18 DIAGNOSIS — R404 Transient alteration of awareness: Secondary | ICD-10-CM | POA: Diagnosis not present

## 2021-07-18 DIAGNOSIS — Z743 Need for continuous supervision: Secondary | ICD-10-CM | POA: Diagnosis not present

## 2021-07-18 DIAGNOSIS — I120 Hypertensive chronic kidney disease with stage 5 chronic kidney disease or end stage renal disease: Secondary | ICD-10-CM | POA: Diagnosis not present

## 2021-07-18 DIAGNOSIS — I3139 Other pericardial effusion (noninflammatory): Secondary | ICD-10-CM | POA: Diagnosis not present

## 2021-07-18 DIAGNOSIS — R410 Disorientation, unspecified: Secondary | ICD-10-CM | POA: Diagnosis not present

## 2021-07-18 DIAGNOSIS — I517 Cardiomegaly: Secondary | ICD-10-CM | POA: Diagnosis not present

## 2021-07-18 DIAGNOSIS — R5383 Other fatigue: Secondary | ICD-10-CM | POA: Diagnosis not present

## 2021-07-18 DIAGNOSIS — J90 Pleural effusion, not elsewhere classified: Secondary | ICD-10-CM | POA: Diagnosis not present

## 2021-07-18 DIAGNOSIS — R918 Other nonspecific abnormal finding of lung field: Secondary | ICD-10-CM | POA: Diagnosis not present

## 2021-07-18 DIAGNOSIS — M549 Dorsalgia, unspecified: Secondary | ICD-10-CM | POA: Diagnosis not present

## 2021-07-18 DIAGNOSIS — R059 Cough, unspecified: Secondary | ICD-10-CM | POA: Diagnosis not present

## 2021-07-18 DIAGNOSIS — N189 Chronic kidney disease, unspecified: Secondary | ICD-10-CM | POA: Diagnosis not present

## 2021-07-18 NOTE — Telephone Encounter (Signed)
Patient's daughter states that he kidney doctor took her off of her lasix, she has been off of lasix for a week.   However she woke up last night coughing and she is having back pain. She is hardly peeing.  Daughter is wondering if she should take her mother to the hospital to be admitted.

## 2021-07-18 NOTE — Telephone Encounter (Signed)
The renal MD discontinued her lasix and Hydralazine because she was not eating a drinking and they were worried about dehydration. The family at home with the patient called the daughter with concerns about lots of coughing last night. She is having back pain and not voiding very much. The daughters concern is that she has been lethargic according to family for most of the day. Daughter said she was going to head to see her mom when we hangup and call EMS.   Will notify MD as requested.

## 2021-08-14 DIAGNOSIS — M546 Pain in thoracic spine: Secondary | ICD-10-CM | POA: Diagnosis not present

## 2021-08-14 DIAGNOSIS — M7918 Myalgia, other site: Secondary | ICD-10-CM | POA: Diagnosis not present

## 2021-08-14 DIAGNOSIS — M8008XD Age-related osteoporosis with current pathological fracture, vertebra(e), subsequent encounter for fracture with routine healing: Secondary | ICD-10-CM | POA: Diagnosis not present

## 2021-11-05 ENCOUNTER — Ambulatory Visit: Payer: Medicare Other | Admitting: Legal Medicine

## 2021-11-05 DIAGNOSIS — I10 Essential (primary) hypertension: Secondary | ICD-10-CM

## 2021-11-05 DIAGNOSIS — N184 Chronic kidney disease, stage 4 (severe): Secondary | ICD-10-CM

## 2021-11-05 DIAGNOSIS — I2510 Atherosclerotic heart disease of native coronary artery without angina pectoris: Secondary | ICD-10-CM

## 2021-11-05 DIAGNOSIS — I5022 Chronic systolic (congestive) heart failure: Secondary | ICD-10-CM

## 2021-11-05 DIAGNOSIS — E785 Hyperlipidemia, unspecified: Secondary | ICD-10-CM

## 2021-11-05 DIAGNOSIS — E441 Mild protein-calorie malnutrition: Secondary | ICD-10-CM

## 2021-11-13 DIAGNOSIS — M7918 Myalgia, other site: Secondary | ICD-10-CM | POA: Diagnosis not present

## 2021-11-13 DIAGNOSIS — M546 Pain in thoracic spine: Secondary | ICD-10-CM | POA: Diagnosis not present

## 2021-11-13 DIAGNOSIS — M8008XD Age-related osteoporosis with current pathological fracture, vertebra(e), subsequent encounter for fracture with routine healing: Secondary | ICD-10-CM | POA: Diagnosis not present

## 2021-12-13 ENCOUNTER — Other Ambulatory Visit: Payer: Self-pay | Admitting: Physician Assistant

## 2021-12-18 DIAGNOSIS — I129 Hypertensive chronic kidney disease with stage 1 through stage 4 chronic kidney disease, or unspecified chronic kidney disease: Secondary | ICD-10-CM | POA: Diagnosis not present

## 2021-12-18 DIAGNOSIS — N184 Chronic kidney disease, stage 4 (severe): Secondary | ICD-10-CM | POA: Diagnosis not present

## 2021-12-18 DIAGNOSIS — N2581 Secondary hyperparathyroidism of renal origin: Secondary | ICD-10-CM | POA: Diagnosis not present

## 2021-12-18 DIAGNOSIS — I2510 Atherosclerotic heart disease of native coronary artery without angina pectoris: Secondary | ICD-10-CM | POA: Diagnosis not present

## 2021-12-18 DIAGNOSIS — M1990 Unspecified osteoarthritis, unspecified site: Secondary | ICD-10-CM | POA: Diagnosis not present

## 2021-12-18 DIAGNOSIS — I5020 Unspecified systolic (congestive) heart failure: Secondary | ICD-10-CM | POA: Diagnosis not present

## 2022-01-13 ENCOUNTER — Other Ambulatory Visit: Payer: Self-pay | Admitting: Cardiovascular Disease

## 2022-02-10 DIAGNOSIS — M8008XD Age-related osteoporosis with current pathological fracture, vertebra(e), subsequent encounter for fracture with routine healing: Secondary | ICD-10-CM | POA: Diagnosis not present

## 2022-02-10 DIAGNOSIS — M7918 Myalgia, other site: Secondary | ICD-10-CM | POA: Diagnosis not present

## 2022-02-10 DIAGNOSIS — M546 Pain in thoracic spine: Secondary | ICD-10-CM | POA: Diagnosis not present

## 2022-02-12 ENCOUNTER — Other Ambulatory Visit: Payer: Self-pay | Admitting: Cardiovascular Disease

## 2022-03-16 ENCOUNTER — Other Ambulatory Visit: Payer: Self-pay | Admitting: Cardiovascular Disease

## 2022-04-01 ENCOUNTER — Other Ambulatory Visit: Payer: Self-pay | Admitting: Cardiovascular Disease

## 2022-04-28 ENCOUNTER — Ambulatory Visit: Payer: Medicare Other | Attending: Physician Assistant | Admitting: Physician Assistant

## 2022-04-28 ENCOUNTER — Encounter: Payer: Self-pay | Admitting: Physician Assistant

## 2022-04-28 VITALS — BP 104/70 | HR 68 | Ht 62.0 in | Wt 97.6 lb

## 2022-04-28 DIAGNOSIS — I2510 Atherosclerotic heart disease of native coronary artery without angina pectoris: Secondary | ICD-10-CM

## 2022-04-28 DIAGNOSIS — I5020 Unspecified systolic (congestive) heart failure: Secondary | ICD-10-CM

## 2022-04-28 DIAGNOSIS — E785 Hyperlipidemia, unspecified: Secondary | ICD-10-CM | POA: Diagnosis not present

## 2022-04-28 DIAGNOSIS — N184 Chronic kidney disease, stage 4 (severe): Secondary | ICD-10-CM | POA: Diagnosis not present

## 2022-04-28 DIAGNOSIS — I10 Essential (primary) hypertension: Secondary | ICD-10-CM

## 2022-04-28 MED ORDER — HYDRALAZINE HCL 10 MG PO TABS: 5.0000 mg | ORAL_TABLET | Freq: Three times a day (TID) | ORAL | 3 refills | Status: DC

## 2022-04-28 NOTE — Assessment & Plan Note (Addendum)
She is followed by nephrology.

## 2022-04-28 NOTE — Assessment & Plan Note (Signed)
History of anterior STEMI in 2017 treated with DES to the LAD x2.  She has known chronic occlusion of the RCA.  She is doing well without angina.  Continue Plavix 75 mg daily, nebivolol 5 mg daily, Lipitor 80 mg daily.  Follow-up in 1 year.

## 2022-04-28 NOTE — Assessment & Plan Note (Signed)
EF 30-35 by echocardiogram in 2020.  NYHA IIb.  Volume status appears stable.  She does note lightheadedness with standing at times.  She did have issues with volume excess/?Pneumonia several months ago when she was taken off of daily furosemide.  Continue furosemide 20 mg daily, nebivolol 10 mg daily.  Decrease hydralazine to 5 mg 3 times a day.  If she continues to have symptoms, we can decrease her dose of nebivolol.

## 2022-04-28 NOTE — Assessment & Plan Note (Signed)
Most recent LDL optimal.  Continue Lipitor 80 mg daily.  Obtain follow-up CMET today.

## 2022-04-28 NOTE — Progress Notes (Signed)
Cardiology Office Note:    Date:  04/28/2022   ID:  Sophia Snyder, DOB 12/24/1918, MRN [id number]  PCP:  Lillard Anes, [place] [facility] Cardiologist:  Sherren Mocha, MD Cardiology APP:  Sh[facility]on [facility]    Referring MD: Lillard Anes,*   Chief Complaint:  F/u for CAD, CHF    Patient Profile: Chronic Systolic CHF Ischemic CM ACEi DC'd in past due to increasing Creatinine (no ACE/ARB/ARNI/MRA) EF 20-25 >> 30-35 Echocardiogram 02/2019: EF 30-35, Gr 1 DD Chronic kidney disease  Coronary artery disease  S/p Ant STEMI 5/17 >> DES x 2 to pLAD; RCA with CTO Hypertension  Hyperlipidemia  Chronic kidney disease 3  Prior CV Studies: Echocardiogram 02/27/2019 EF 30-35, mild LVH, Gr 1 DD, inf and ant severe HK   Limited Echo 5/17 EF 30-35, diff HK and apical, anterior, anteroseptal, inferoseptal AK, No LV clot   Echo 5/17 Mod conc LVH, EF 30-35, anterior and anteroseptal AK and DK of apical septum and true apex, restrictive physio, mild TR, PASP 35   LHC 5/17 LAD ostial 100 LCx irregs RCA mid 100 with L-R collats (CTO) EF 20-25, 2+ MR PCI: 2.5 x 32 mm Synergy DES and 3 x 28 mm Synergy DES to LAD  History of Present Illness:   Sophia Snyder is a 88 y.o. female with the above problem list.  She was last seen in June 2022.  She returns for follow-up.  She is here with her daughter. She continues to live with her great grandson, whom she raised (she started caring for him in her 55s). He helps take care of her, but she does a lot of her own housework. Her back pain has significantly improved. She has not had chest pain, syncope, orthopnea, leg edema. She has not had significant shortness of breath. She does get lightheaded when she stands up.         Past Medical History:  Diagnosis Date   Arthritis    CAD (coronary artery disease), native coronary artery    chronic rt coroanry artery occlusion   Essential hypertension 12/30/2015   GERD  (gastroesophageal reflux disease)    Hyperlipidemia LDL goal <70 12/30/2015   S/P angioplasty with stent 12/28/2015   DES to LAD   ST elevation myocardial infarction (STEMI) of anterior wall ([place]) 12/28/2015   Current Medications: Current Meds  Medication Sig   atorvastatin (LIPITOR) 80 MG tablet TAKE 1 TABLET BY MOUTH DAILY AT 6 PM.   clopidogrel (PLAVIX) 75 MG tablet TAKE 1 TABLET BY MOUTH DAILY   famotidine (PEPCID) 20 MG tablet TAKE 1 TABLET BY MOUTH DAILY.   furosemide (LASIX) 40 MG tablet Take 20 mg by mouth daily.   hydrALAZINE (APRESOLINE) 10 MG tablet Take 0.5 tablets (5 mg total) by mouth 3 (three) times daily.   nebivolol (BYSTOLIC) 10 MG tablet TAKE 1/2 A TABLET BY MOUTH TWICE A DAY   nitroGLYCERIN (NITROSTAT) 0.4 MG SL tablet Place 1 tablet (0.4 mg total) under the tongue every 5 (five) minutes x 3 doses as needed for chest pain.   OVER THE COUNTER MEDICATION Place 1-2 sprays into both nostrils at bedtime. Allergy nose spray   traMADol (ULTRAM) 50 MG tablet Take 50 mg by mouth daily.   trolamine salicylate (ASPERCREME) 10 % cream Apply 1 application topically daily as needed for muscle pain (shoulder/knee pain).   [DISCONTINUED] hydrALAZINE (APRESOLINE) 10 MG tablet TAKE 1 TABLET BY MOUTH THREE TIMES A DAY.  Allergies:   Patient has no known allergies.   Social History   Tobacco Use   Smoking status: Never   Smokeless tobacco: Never  Vaping Use   Vaping Use: Never used  Substance Use Topics   Alcohol use: No   Drug use: No    Family Hx: The patient's family history includes Heart attack (age of onset: 80) in her father; Transient ischemic attack (age of onset: 67) in her mother.  Review of Systems  Gastrointestinal:  Negative for hematochezia and melena.  Genitourinary:  Negative for hematuria.     EKGs/Labs/Other Test Reviewed:    EKG:  EKG is  ordered today.  The ekg ordered today demonstrates NSR, HR 68, increased noise artifact, TW inversions in 1, aVL,  V2, QTc 435, no change from prior tracing.   Recent Labs: 05/06/2021: ALT 16; BUN 48; Creatinine, Ser 2.66; Hemoglobin 11.1; Platelets 216; Potassium 4.8; Sodium 138   Recent Lipid Panel Recent Labs    05/06/21 1432  CHOL 176  TRIG 81  HDL 73  LDLCALC 88     Risk Assessment/Calculations/Metrics:              Physical Exam:    VS:  BP 104/70   Pulse 68   Ht 5' 2"  (1.575 m)   Wt 97 lb 9.6 oz (44.3 kg)   SpO2 93%   BMI 17.85 kg/m     Wt Readings from Last 3 Encounters:  04/28/22 97 lb 9.6 oz (44.3 kg)  05/06/21 101 lb 3.2 oz (45.9 kg)  01/15/21 105 lb 6.4 oz (47.8 kg)    Constitutional:      Appearance: Healthy appearance. Not in distress.  Neck:     Vascular: No JVR.  Pulmonary:     Effort: Pulmonary effort is normal.     Breath sounds: No wheezing. No rales.  Cardiovascular:     Normal rate. Regular rhythm. Normal S1. Normal S2.      Murmurs: There is no murmur.  Edema:    Peripheral edema absent.  Abdominal:     Palpations: Abdomen is soft.  Musculoskeletal:     Cervical back: Neck supple. Skin:    General: Skin is warm and dry.  Neurological:     Mental Status: Alert and oriented to person, place and time.         ASSESSMENT & PLAN:   CAD (coronary artery disease) History of anterior STEMI in 2017 treated with DES to the LAD x2.  She has known chronic occlusion of the RCA.  She is doing well without angina.  Continue Plavix 75 mg daily, nebivolol 5 mg daily, Lipitor 80 mg daily.  Follow-up in 1 year.  HFrEF (heart failure with reduced ejection fraction) (HCC) EF 30-35 by echocardiogram in 2020.  NYHA IIb.  Volume status appears stable.  She does note lightheadedness with standing at times.  She did have issues with volume excess/?Pneumonia several months ago when she was taken off of daily furosemide.  Continue furosemide 20 mg daily, nebivolol 10 mg daily.  Decrease hydralazine to 5 mg 3 times a day.  If she continues to have symptoms, we can decrease  her dose of nebivolol.  Hyperlipidemia LDL goal <70 Most recent LDL optimal.  Continue Lipitor 80 mg daily.  Obtain follow-up CMET today.  Essential hypertension Blood pressure running somewhat low.  She does have some orthostatic intolerance.  Decrease hydralazine as noted.  Continue Bystolic 5 mg daily.  Chronic kidney disease, stage 4 (  severe) [facility]) She is followed by nephrology.          Dispo:  Return in about 1 year (around 04/29/2023) for Routine Follow Up, w/ Dr. Burt Knack, or Richardson Dopp, PA-C.   Medication Adjustments/Labs and Tests Ordered: Current medicines are reviewed at length with the patient today.  Concerns regarding medicines are outlined above.  Tests Ordered: Orders Placed This Encounter  Procedures   Comp Met (CMET)   EKG 12-Lead   Medication Changes: Meds ordered this encounter  Medications   hydrALAZINE (APRESOLINE) 10 MG tablet    Sig: Take 0.5 tablets (5 mg total) by mouth 3 (three) times daily.    Dispense:  135 tablet    Refill:  3   Signed, Richardson Dopp, PA-C  04/28/2022 3:48 PM    [address], [place], [place]  [address] Phone: [phone]; Fax: [phone]

## 2022-04-28 NOTE — Patient Instructions (Signed)
Medication Instructions:  Your physician has recommended you make the following change in your medication:   DECREASE the Hydralazine to 10 mg taking 1/2 tablet three times a day   *If you need a refill on your cardiac medications before your next appointment, please call your pharmacy*   Lab Work: TODAY:  CMET  If you have labs (blood work) drawn today and your tests are completely normal, you will receive your results only by: [place] (if you have MyChart) OR A paper copy in the mail If you have any lab test that is abnormal or we need to change your treatment, we will call you to review the results.   Testing/Procedures: None ordered   Follow-Up: At [facility], you and your health needs are our priority.  As part of our continuing mission to provide you with exceptional heart care, we have created designated Provider Care Teams.  These Care Teams include your primary Cardiologist (physician) and Advanced Practice Providers (APPs -  Physician Assistants and Nurse Practitioners) who all work together to provide you with the care you need, when you need it.  We recommend signing up for the patient portal called "MyChart".  Sign up information is provided on this After Visit Summary.  MyChart is used to connect with patients for Virtual Visits (Telemedicine).  Patients are able to view lab/test results, encounter notes, upcoming appointments, etc.  Non-urgent messages can be sent to your provider as well.   To learn more about what you can do with MyChart, go to [email or web address].    Your next appointment:   1 year(s)  The format for your next appointment:   In Person  Provider:   Sherren Mocha, MD  or Richardson Dopp, PA-C         Other Instructions   Important Information About Sugar

## 2022-04-28 NOTE — Assessment & Plan Note (Signed)
Blood pressure running somewhat low.  She does have some orthostatic intolerance.  Decrease hydralazine as noted.  Continue Bystolic 5 mg daily.

## 2022-04-29 LAB — COMPREHENSIVE METABOLIC PANEL
ALT: 12 IU/L (ref 0–32)
AST: 34 IU/L (ref 0–40)
Albumin/Globulin Ratio: 1.7 (ref 1.2–2.2)
Albumin: 4.5 g/dL (ref 3.6–4.6)
Alkaline Phosphatase: 83 IU/L (ref 44–121)
BUN/Creatinine Ratio: 19 (ref 12–28)
BUN: 46 mg/dL — ABNORMAL HIGH (ref 10–36)
Bilirubin Total: 0.5 mg/dL (ref 0.0–1.2)
CO2: 23 mmol/L (ref 20–29)
Calcium: 9.6 mg/dL (ref 8.7–10.3)
Chloride: 98 mmol/L (ref 96–106)
Creatinine, Ser: 2.38 mg/dL — ABNORMAL HIGH (ref 0.57–1.00)
Globulin, Total: 2.6 g/dL (ref 1.5–4.5)
Glucose: 91 mg/dL (ref 70–99)
Potassium: 5.1 mmol/L (ref 3.5–5.2)
Sodium: 139 mmol/L (ref 134–144)
Total Protein: 7.1 g/dL (ref 6.0–8.5)
eGFR: 18 mL/min/{1.73_m2} — ABNORMAL LOW (ref >59)

## 2022-04-30 NOTE — Addendum Note (Signed)
Addended by: Gaetano Net on: 04/30/2022 10:34 AM   Modules accepted: Orders

## 2022-05-18 ENCOUNTER — Other Ambulatory Visit: Payer: Self-pay | Admitting: Cardiovascular Disease

## 2022-05-21 DIAGNOSIS — M7918 Myalgia, other site: Secondary | ICD-10-CM | POA: Diagnosis not present

## 2022-05-21 DIAGNOSIS — M546 Pain in thoracic spine: Secondary | ICD-10-CM | POA: Diagnosis not present

## 2022-05-21 DIAGNOSIS — M8008XD Age-related osteoporosis with current pathological fracture, vertebra(e), subsequent encounter for fracture with routine healing: Secondary | ICD-10-CM | POA: Diagnosis not present

## 2022-06-24 DIAGNOSIS — Z23 Encounter for immunization: Secondary | ICD-10-CM | POA: Diagnosis not present

## 2022-10-02 ENCOUNTER — Other Ambulatory Visit: Payer: Self-pay | Admitting: Cardiology

## 2023-02-22 ENCOUNTER — Telehealth: Payer: Self-pay | Admitting: Cardiovascular Disease

## 2023-02-22 NOTE — Telephone Encounter (Signed)
  Daughter would like to switch her mother from Dr Excell Seltzer to Dr Bing Matter in [place] because that is where she lives.

## 2023-02-22 NOTE — Telephone Encounter (Signed)
This is fine with me.  thx

## 2023-02-24 ENCOUNTER — Telehealth: Payer: Self-pay | Admitting: Cardiology

## 2023-02-24 NOTE — Telephone Encounter (Signed)
Daughter stated patient wants to get orders for lab work prior to office visit.

## 2023-02-24 NOTE — Telephone Encounter (Signed)
Spoke with daughter. She just wanted to get sooner appt if available. Front desk aware of need for sooner appt if comes available.

## 2023-03-03 ENCOUNTER — Ambulatory Visit (INDEPENDENT_AMBULATORY_CARE_PROVIDER_SITE_OTHER): Payer: Medicare Other | Admitting: Family Medicine

## 2023-03-03 ENCOUNTER — Ambulatory Visit: Payer: Medicare Other | Attending: Cardiology | Admitting: Cardiology

## 2023-03-03 ENCOUNTER — Encounter: Payer: Self-pay | Admitting: Family Medicine

## 2023-03-03 ENCOUNTER — Encounter: Payer: Self-pay | Admitting: Cardiology

## 2023-03-03 VITALS — BP 116/68 | HR 79 | Temp 97.5°F | Ht 60.0 in | Wt 106.0 lb

## 2023-03-03 VITALS — BP 122/82 | HR 83 | Ht 60.0 in | Wt 106.2 lb

## 2023-03-03 DIAGNOSIS — I10 Essential (primary) hypertension: Secondary | ICD-10-CM | POA: Diagnosis not present

## 2023-03-03 DIAGNOSIS — R29898 Other symptoms and signs involving the musculoskeletal system: Secondary | ICD-10-CM

## 2023-03-03 DIAGNOSIS — E785 Hyperlipidemia, unspecified: Secondary | ICD-10-CM | POA: Diagnosis not present

## 2023-03-03 DIAGNOSIS — Z95820 Peripheral vascular angioplasty status with implants and grafts: Secondary | ICD-10-CM | POA: Diagnosis not present

## 2023-03-03 DIAGNOSIS — I2510 Atherosclerotic heart disease of native coronary artery without angina pectoris: Secondary | ICD-10-CM | POA: Diagnosis not present

## 2023-03-03 DIAGNOSIS — R413 Other amnesia: Secondary | ICD-10-CM

## 2023-03-03 DIAGNOSIS — E440 Moderate protein-calorie malnutrition: Secondary | ICD-10-CM | POA: Diagnosis not present

## 2023-03-03 DIAGNOSIS — I5020 Unspecified systolic (congestive) heart failure: Secondary | ICD-10-CM | POA: Diagnosis not present

## 2023-03-03 NOTE — Patient Instructions (Signed)
Medication Instructions:  Your physician recommends that you continue on your current medications as directed. Please refer to the Current Medication list given to you today.  *If you need a refill on your cardiac medications before your next appointment, please call your pharmacy*   Lab Work: Your physician recommends that you return for lab work in: Today for CBC, TSH and CMP  If you have labs (blood work) drawn today and your tests are completely normal, you will receive your results only by: MyChart Message (if you have MyChart) OR A paper copy in the mail If you have any lab test that is abnormal or we need to change your treatment, we will call you to review the results.   Testing/Procedures: NONE   Follow-Up: At [facility], you and your health needs are our priority.  As part of our continuing mission to provide you with exceptional heart care, we have created designated Provider Care Teams.  These Care Teams include your primary Cardiologist (physician) and Advanced Practice Providers (APPs -  Physician Assistants and Nurse Practitioners) who all work together to provide you with the care you need, when you need it.  We recommend signing up for the patient portal called "MyChart".  Sign up information is provided on this After Visit Summary.  MyChart is used to connect with patients for Virtual Visits (Telemedicine).  Patients are able to view lab/test results, encounter notes, upcoming appointments, etc.  Non-urgent messages can be sent to your provider as well.   To learn more about what you can do with MyChart, go to [email or web address].    Your next appointment:   6 month(s)  Provider:   Gypsy Balsam, MD    Other Instructions

## 2023-03-03 NOTE — Progress Notes (Signed)
Cardiology Office Note:    Date:  03/03/2023   ID:  Sophia Snyder, DOB [birth year], MRN [id number]  PCP:  Abigail Miyamoto, MD (Inactive)  Cardiologist:  Gypsy Balsam, MD    Referring MD: No ref. provider found   No chief complaint on file. Weak and tired  History of Present Illness:    Sophia Snyder is a 89 y.o. female past medical history significant for coronary artery disease, in 2017 she suffered from anterior wall MI she did have 2 drug-eluting stent to proximal LAD right coronary artery Complete occlusion, last echocardiogram done in 2020 showed ejection fraction 30 to 35%, additional problem include essential hypertension, hyperlipidemia, chronic kidney failure. She comes today to my office to be established as a patient.  She have difficulty traveling to [place] this while she would like to be followed in our office.  I have to admit for somebody who is 7 and 89 years old she is doing quite well she comes to me with her daughter.  Her daughter described the fact that she is weak tired exhausted couple days ago she had difficulty walking the actual call 911 she refused to go to hospital since that time she is doing a little better but still tired and exhausted.  Denies have any chest pain tightness squeezing pressure burning chest no shortness of breath no dizziness no passing out  Past Medical History:  Diagnosis Date   Arthritis    CAD (coronary artery disease), native coronary artery    chronic rt coroanry artery occlusion   Essential hypertension 12/30/2015   GERD (gastroesophageal reflux disease)    Hyperlipidemia LDL goal <70 12/30/2015   S/P angioplasty with stent 12/28/2015   DES to LAD   ST elevation myocardial infarction (STEMI) of anterior wall (HCC) 12/28/2015    Past Surgical History:  Procedure Laterality Date   BALLOON ANGIOPLASTY, ARTERY     55 or 89 years old   CARDIAC CATHETERIZATION N/A 12/28/2015   Procedure: Left Heart Cath and Coronary  Angiography;  Surgeon: Tonny Bollman, MD;  [facility];  Service: Cardiovascular;  Laterality: N/A;   CARDIAC CATHETERIZATION N/A 12/28/2015   Procedure: Coronary Stent Intervention;  Surgeon: Tonny Bollman, MD;  Location: [facility] INVASIVE CV LAB;  Service: Cardiovascular;  Laterality: N/A;    Current Medications: Current Meds  Medication Sig   atorvastatin (LIPITOR) 80 MG tablet TAKE 1 TABLET BY MOUTH DAILY AT 6 PM.   clopidogrel (PLAVIX) 75 MG tablet TAKE 1 TABLET BY MOUTH DAILY   famotidine (PEPCID) 20 MG tablet TAKE 1 TABLET BY MOUTH DAILY.   furosemide (LASIX) 40 MG tablet TAKE 1 TABLET BY MOUTH DAILY   nebivolol (BYSTOLIC) 10 MG tablet TAKE 1/2 A TABLET BY MOUTH TWICE A DAY   nitroGLYCERIN (NITROSTAT) 0.4 MG SL tablet Place 1 tablet (0.4 mg total) under the tongue every 5 (five) minutes x 3 doses as needed for chest pain.   OVER THE COUNTER MEDICATION Place 1-2 sprays into both nostrils at bedtime. Allergy nose spray   traMADol (ULTRAM) 50 MG tablet Take 50 mg by mouth daily.   trolamine salicylate (ASPERCREME) 10 % cream Apply 1 application topically daily as needed for muscle pain (shoulder/knee pain).     Allergies:   Patient has no known allergies.   Social History   Socioeconomic History   Marital status: Widowed    Spouse name: Not on file   Number of children: 2   Years of education: Not  on file   Highest education level: Not on file  Occupational History   Not on file  Tobacco Use   Smoking status: Never   Smokeless tobacco: Never  Vaping Use   Vaping status: Never Used  Substance and Sexual Activity   Alcohol use: No   Drug use: No   Sexual activity: Never  Other Topics Concern   Not on file  Social History Narrative   Not on file   Social Determinants of Health   Financial Resource Strain: Not on file  Food Insecurity: Not on file  Transportation Needs: Not on file  Physical Activity: Not on file  Stress: Not on file  Social  Connections: Not on file     Family History: The patient's family history includes Heart attack (age of onset: 37) in her father; Transient ischemic attack (age of onset: 9) in her mother. ROS:   Please see the history of present illness.    All 14 point review of systems negative except as described per history of present illness  EKGs/Labs/Other Studies Reviewed:         Recent Labs: 04/28/2022: ALT 12; BUN 46; Creatinine, Ser 2.38; Potassium 5.1; Sodium 139  Recent Lipid Panel    Component Value Date/Time   CHOL 176 05/06/2021 1432   TRIG 81 05/06/2021 1432   HDL 73 05/06/2021 1432   CHOLHDL 2.4 05/06/2021 1432   CHOLHDL 2.3 05/04/2016 1139   VLDL 28 05/04/2016 1139   LDLCALC 88 05/06/2021 1432    Physical Exam:    VS:  BP 122/82 (BP Location: Left Arm, Patient Position: Sitting, Cuff Size: Normal)   Pulse 83   Ht 5' (1.524 m)   Wt 106 lb 3.2 oz (48.2 kg)   SpO2 97%   BMI 20.74 kg/m     Wt Readings from Last 3 Encounters:  03/03/23 106 lb 3.2 oz (48.2 kg)  04/28/22 97 lb 9.6 oz (44.3 kg)  05/06/21 101 lb 3.2 oz (45.9 kg)     GEN:  Well nourished, well developed in no acute distress HEENT: Normal NECK: No JVD; No carotid bruits LYMPHATICS: No lymphadenopathy CARDIAC: RRR, no murmurs, no rubs, no gallops RESPIRATORY:  Clear to auscultation without rales, wheezing or rhonchi  ABDOMEN: Soft, non-tender, non-distended MUSCULOSKELETAL:  No edema; No deformity  SKIN: Warm and dry LOWER EXTREMITIES: no swelling NEUROLOGIC:  Alert and oriented x 3 PSYCHIATRIC:  Normal affect   ASSESSMENT:    1. HFrEF (heart failure with reduced ejection fraction) (HCC)   2. Essential hypertension   3. Coronary artery disease involving native coronary artery of native heart without angina pectoris   4. S/P angioplasty with stent   5. Hyperlipidemia LDL goal <70    PLAN:    In order of problems listed above:  Congestive heart failure with diminished ejection fraction this  89 years old lady.  She is on minimal: Medical therapy and I have to admit that overall clinically doing well.  She appears to be compensated on physical exam. Essential hypertension blood pressure well-controlled continue present management. S/p angioplasty she is on antiplatelet therapy which I will continue. Dyslipidemia she is on Lipitor 80 we will continue. Weakness fatigue tiredness could be just simply declining because of her advanced age but I will check complete metabolic panel, CBC TSH make sure we are not missing any reversible correctable causes   Medication Adjustments/Labs and Tests Ordered: Current medicines are reviewed at length with the patient today.  Concerns regarding medicines  are outlined above.  Orders Placed This Encounter  Procedures   Comprehensive metabolic panel   CBC with Differential/Platelet   TSH   EKG 12-Lead   Medication changes: No orders of the defined types were placed in this encounter.   Signed, Georgeanna Lea, MD, [facility] 03/03/2023 11:57 AM     [facility]

## 2023-03-03 NOTE — Progress Notes (Unsigned)
Subjective:  Patient ID: Sophia Snyder, female    DOB: 1919-07-09  Age: 89 y.o. MRN: [id number]  Chief Complaint  Patient presents with   Weakness    HPI  Complaining of weakness in legs and pain in back which occurred July 5th.      03/03/2023    1:42 PM 05/06/2021    1:35 PM  Depression screen PHQ 2/9  Decreased Interest 0 0  Down, Depressed, Hopeless 0 0  PHQ - 2 Score 0 0        03/03/2023    1:46 PM  Fall Risk   Falls in the past year? 1  Number falls in past yr: 1  Injury with Fall? 0  Risk for fall due to : No Fall Risks  Follow up Falls evaluation completed    Patient Care Team: Abigail Miyamoto, MD (Inactive) as PCP - General (Family Medicine) Tonny Bollman, MD as PCP - Cardiology (Cardiology) Kennon Rounds as Physician Assistant (Cardiology)   Review of Systems  Constitutional:  Negative for appetite change, fatigue and fever.  HENT:  Negative for congestion, ear pain, sinus pressure and sore throat.   Respiratory:  Negative for cough, chest tightness, shortness of breath and wheezing.   Cardiovascular:  Negative for chest pain and palpitations.  Gastrointestinal:  Negative for abdominal pain, constipation, diarrhea, nausea and vomiting.  Genitourinary:  Negative for dysuria and hematuria.  Musculoskeletal:  Negative for arthralgias, back pain, joint swelling and myalgias.  Skin:  Negative for rash.  Neurological:  Positive for weakness. Negative for dizziness and headaches.  Psychiatric/Behavioral:  Negative for dysphoric mood. The patient is not nervous/anxious.     Current Outpatient Medications on File Prior to Visit  Medication Sig Dispense Refill   atorvastatin (LIPITOR) 80 MG tablet TAKE 1 TABLET BY MOUTH DAILY AT 6 PM. 30 tablet 0   clopidogrel (PLAVIX) 75 MG tablet TAKE 1 TABLET BY MOUTH DAILY 90 tablet 3   famotidine (PEPCID) 20 MG tablet TAKE 1 TABLET BY MOUTH DAILY. 90 tablet 3   feeding supplement (BOOST HIGH PROTEIN) LIQD  Take 1 Container by mouth 2 (two) times daily between meals.     furosemide (LASIX) 40 MG tablet TAKE 1 TABLET BY MOUTH DAILY (Patient taking differently: Take 20 mg by mouth daily.) 90 tablet 2   nebivolol (BYSTOLIC) 10 MG tablet TAKE 1/2 A TABLET BY MOUTH TWICE A DAY 90 tablet 3   nitroGLYCERIN (NITROSTAT) 0.4 MG SL tablet Place 1 tablet (0.4 mg total) under the tongue every 5 (five) minutes x 3 doses as needed for chest pain. (Patient not taking: Reported on 03/03/2023) 25 tablet 2   No current facility-administered medications on file prior to visit.   Past Medical History:  Diagnosis Date   Arthritis    CAD (coronary artery disease), native coronary artery    chronic rt coroanry artery occlusion   Essential hypertension 12/30/2015   GERD (gastroesophageal reflux disease)    Hyperlipidemia LDL goal <70 12/30/2015   S/P angioplasty with stent 12/28/2015   DES to LAD   ST elevation myocardial infarction (STEMI) of anterior wall (HCC) 12/28/2015   Past Surgical History:  Procedure Laterality Date   BALLOON ANGIOPLASTY, ARTERY     50 or 89 years old   CARDIAC CATHETERIZATION N/A 12/28/2015   Procedure: Left Heart Cath and Coronary Angiography;  Surgeon: Tonny Bollman, MD;  Location: [facility] INVASIVE CV LAB;  Service: Cardiovascular;  Laterality: N/A;   CARDIAC CATHETERIZATION  N/A 12/28/2015   Procedure: Coronary Stent Intervention;  Surgeon: Tonny Bollman, MD;  Location: [facility] INVASIVE CV LAB;  Service: Cardiovascular;  Laterality: N/A;    Family History  Problem Relation Age of Onset   Transient ischemic attack Mother 63   Heart attack Father 65   Heart attack Sister    Heart attack Sister    Heart attack Sister    Heart attack Brother    Heart attack Brother    Heart attack Brother    Social History   Socioeconomic History   Marital status: Widowed    Spouse name: Not on file   Number of children: 2   Years of education: Not on file   Highest education level: Not on file   Occupational History   Not on file  Tobacco Use   Smoking status: Never   Smokeless tobacco: Never  Vaping Use   Vaping status: Never Used  Substance and Sexual Activity   Alcohol use: No   Drug use: No   Sexual activity: Never  Other Topics Concern   Not on file  Social History Narrative   Not on file   Social Determinants of Health   Financial Resource Strain: Not on file  Food Insecurity: Not on file  Transportation Needs: Not on file  Physical Activity: Not on file  Stress: Not on file  Social Connections: Not on file    Objective:  BP 116/68 (BP Location: Right Arm, Patient Position: Sitting)   Pulse 79   Temp (!) 97.5 F (36.4 C) (Temporal)   Ht 5' (1.524 m)   Wt 106 lb (48.1 kg)   SpO2 99%   BMI 20.70 kg/m      03/03/2023    1:47 PM 03/03/2023   11:16 AM 04/28/2022    3:01 PM  BP/Weight  Systolic BP 116 122 104  Diastolic BP 68 82 70  Wt. (Lbs) 106 106.2 97.6  BMI 20.7 kg/m2 20.74 kg/m2 17.85 kg/m2    Physical Exam Vitals reviewed.  Constitutional:      Appearance: Normal appearance. She is normal weight.  Cardiovascular:     Rate and Rhythm: Normal rate and regular rhythm.     Pulses: Normal pulses.     Heart sounds: Normal heart sounds.  Pulmonary:     Effort: Pulmonary effort is normal.     Breath sounds: Normal breath sounds.  Abdominal:     General: Abdomen is flat. Bowel sounds are normal.     Palpations: Abdomen is soft.  Neurological:     Mental Status: She is alert and oriented to person, place, and time.  Psychiatric:        Mood and Affect: Mood normal.        Behavior: Behavior normal.     Diabetic Foot Exam - Simple   No data filed      Lab Results  Component Value Date   WBC 6.0 03/03/2023   HGB 10.5 (L) 03/03/2023   HCT 31.6 (L) 03/03/2023   PLT 268 03/03/2023   GLUCOSE 132 (H) 03/03/2023   CHOL 176 05/06/2021   TRIG 81 05/06/2021   HDL 73 05/06/2021   LDLCALC 88 05/06/2021   ALT 10 03/03/2023   AST 20  03/03/2023   NA 140 03/03/2023   K 3.9 03/03/2023   CL 99 03/03/2023   CREATININE 0.76 03/03/2023   BUN 23 03/03/2023   CO2 24 03/03/2023   TSH 3.280 03/03/2023   INR 1.16 12/28/2015  HGBA1C 5.4 12/28/2015      Assessment & Plan:    Moderate protein-calorie malnutrition (HCC) Assessment & Plan: Sent Boost high protein Check labs.  Orders: -     B12 and Folate Panel -     Methylmalonic acid, serum -     Sedimentation rate  Memory loss Assessment & Plan: Check labs.  Orders: -     B12 and Folate Panel -     Methylmalonic acid, serum -     Sedimentation rate  Weakness of both lower extremities Assessment & Plan: Check labs.  Orders: -     B12 and Folate Panel -     Methylmalonic acid, serum -     Sedimentation rate     No orders of the defined types were placed in this encounter.   Orders Placed This Encounter  Procedures   B12 and Folate Panel   Methylmalonic acid, serum   Sedimentation rate     Follow-up: Return in about 4 weeks (around 03/31/2023) for awv.   I,Lauren M Auman,acting as a scribe for Blane Ohara, MD.,have documented all relevant documentation on the behalf of Blane Ohara, MD,as directed by  Blane Ohara, MD while in the presence of Blane Ohara, MD.   Clayborn Bigness I Leal-Borjas,acting as a scribe for Blane Ohara, MD.,have documented all relevant documentation on the behalf of Blane Ohara, MD,as directed by  Blane Ohara, MD while in the presence of Blane Ohara, MD.    An After Visit Summary was printed and given to the patient.  Blane Ohara, MD [facility] [phone]

## 2023-03-04 LAB — CBC WITH DIFFERENTIAL/PLATELET
Basophils Absolute: 0.1 10*3/uL (ref 0.0–0.2)
Basos: 1 %
EOS (ABSOLUTE): 0.1 10*3/uL (ref 0.0–0.4)
Eos: 2 %
Hematocrit: 31.6 % — ABNORMAL LOW (ref 34.0–46.6)
Hemoglobin: 10.5 g/dL — ABNORMAL LOW (ref 11.1–15.9)
Immature Grans (Abs): 0 10*3/uL (ref 0.0–0.1)
Immature Granulocytes: 0 %
Lymphocytes Absolute: 1.3 10*3/uL (ref 0.7–3.1)
Lymphs: 21 %
MCH: 30.8 pg (ref 26.6–33.0)
MCHC: 33.2 g/dL (ref 31.5–35.7)
MCV: 93 fL (ref 79–97)
Monocytes Absolute: 0.6 10*3/uL (ref 0.1–0.9)
Monocytes: 9 %
Neutrophils Absolute: 4 10*3/uL (ref 1.4–7.0)
Neutrophils: 67 %
Platelets: 268 10*3/uL (ref 150–450)
RBC: 3.41 x10E6/uL — ABNORMAL LOW (ref 3.77–5.28)
RDW: 13.8 % (ref 11.7–15.4)
WBC: 6 10*3/uL (ref 3.4–10.8)

## 2023-03-04 LAB — COMPREHENSIVE METABOLIC PANEL
ALT: 10 IU/L (ref 0–32)
AST: 20 IU/L (ref 0–40)
Albumin: 4.3 g/dL (ref 3.6–4.6)
Alkaline Phosphatase: 71 IU/L (ref 44–121)
BUN/Creatinine Ratio: 30 — ABNORMAL HIGH (ref 12–28)
BUN: 23 mg/dL (ref 10–36)
Bilirubin Total: 0.3 mg/dL (ref 0.0–1.2)
CO2: 24 mmol/L (ref 20–29)
Calcium: 9.6 mg/dL (ref 8.7–10.3)
Chloride: 99 mmol/L (ref 96–106)
Creatinine, Ser: 0.76 mg/dL (ref 0.57–1.00)
Globulin, Total: 3.1 g/dL (ref 1.5–4.5)
Glucose: 132 mg/dL — ABNORMAL HIGH (ref 70–99)
Potassium: 3.9 mmol/L (ref 3.5–5.2)
Sodium: 140 mmol/L (ref 134–144)
Total Protein: 7.4 g/dL (ref 6.0–8.5)
eGFR: 69 mL/min/{1.73_m2} (ref >59)

## 2023-03-04 LAB — TSH: TSH: 3.28 u[IU]/mL (ref 0.450–4.500)

## 2023-03-06 DIAGNOSIS — R413 Other amnesia: Secondary | ICD-10-CM | POA: Insufficient documentation

## 2023-03-06 DIAGNOSIS — E440 Moderate protein-calorie malnutrition: Secondary | ICD-10-CM

## 2023-03-06 DIAGNOSIS — R29898 Other symptoms and signs involving the musculoskeletal system: Secondary | ICD-10-CM

## 2023-03-06 HISTORY — DX: Moderate protein-calorie malnutrition: E44.0

## 2023-03-06 HISTORY — DX: Other symptoms and signs involving the musculoskeletal system: R29.898

## 2023-03-06 HISTORY — DX: Other amnesia: R41.3

## 2023-03-06 NOTE — Assessment & Plan Note (Signed)
Check labs 

## 2023-03-06 NOTE — Assessment & Plan Note (Signed)
Sent Boost high protein Check labs.

## 2023-03-06 NOTE — Assessment & Plan Note (Addendum)
Check labs 

## 2023-03-08 ENCOUNTER — Telehealth: Payer: Self-pay

## 2023-03-08 DIAGNOSIS — D582 Other hemoglobinopathies: Secondary | ICD-10-CM

## 2023-03-08 LAB — METHYLMALONIC ACID, SERUM: Methylmalonic Acid: 332 nmol/L (ref 0–378)

## 2023-03-08 LAB — SEDIMENTATION RATE: Sed Rate: 18 mm/hr (ref 0–40)

## 2023-03-08 LAB — B12 AND FOLATE PANEL
Folate: >20 ng/mL (ref >3.0)
Vitamin B-12: 849 pg/mL (ref 232–1245)

## 2023-03-08 NOTE — Telephone Encounter (Addendum)
Patient daughter made aware, verbalized understanding. Appt made for lab.    ----- Message from Blane Ohara sent at 03/07/2023  8:13 AM EDT ----- Regarding: Lab results.  They did not cross but I was able to look these up. Hemoglobin mildly low.  Recommend repeat in 2 weeks. Liver and kidney function normal. Thyroid normal. Sed rate normal. B12, folate normal.

## 2023-03-09 ENCOUNTER — Telehealth: Payer: Self-pay

## 2023-03-09 NOTE — Telephone Encounter (Signed)
Spoke with Kirby Funk, notified of results

## 2023-03-09 NOTE — Telephone Encounter (Signed)
-----   Message from Gypsy Balsam sent at 03/09/2023 12:42 PM EDT ----- All blood test looking quite good, mild anemia however she was worse anemic even 3 years ago so it is better

## 2023-03-17 ENCOUNTER — Other Ambulatory Visit: Payer: Medicare Other

## 2023-03-17 DIAGNOSIS — D582 Other hemoglobinopathies: Secondary | ICD-10-CM

## 2023-04-13 ENCOUNTER — Ambulatory Visit (INDEPENDENT_AMBULATORY_CARE_PROVIDER_SITE_OTHER): Payer: Medicare Other

## 2023-04-13 VITALS — BP 122/72 | HR 76 | Temp 97.3°F | Ht 60.0 in | Wt 103.0 lb

## 2023-04-13 DIAGNOSIS — Z23 Encounter for immunization: Secondary | ICD-10-CM

## 2023-04-13 DIAGNOSIS — Z0000 Encounter for general adult medical examination without abnormal findings: Secondary | ICD-10-CM | POA: Diagnosis not present

## 2023-04-13 NOTE — Progress Notes (Signed)
Subjective:   Sophia Snyder is a 89 y.o. female who presents for Medicare Annual (Subsequent) preventive examination.  Visit Complete: In person   Cardiac Risk Factors include: advanced age (>32men, >16 women);hypertension     Objective:    Today's Vitals   04/13/23 1414  BP: 122/72  Pulse: 76  Temp: (!) 97.3 F (36.3 C)  TempSrc: Temporal  SpO2: 98%  Weight: 103 lb (46.7 kg)  Height: 5' (1.524 m)   Body mass index is 20.12 kg/m.     04/13/2023    2:16 PM 12/28/2015    5:00 PM  Advanced Directives  Does Patient Have a Medical Advance Directive? Yes No  Type of Advance Directive Living will   Would patient like information on creating a medical advance directive?  No - patient declined information    Current Medications (verified) Outpatient Encounter Medications as of 04/13/2023  Medication Sig   atorvastatin (LIPITOR) 80 MG tablet TAKE 1 TABLET BY MOUTH DAILY AT 6 PM.   clopidogrel (PLAVIX) 75 MG tablet TAKE 1 TABLET BY MOUTH DAILY   famotidine (PEPCID) 20 MG tablet TAKE 1 TABLET BY MOUTH DAILY.   feeding supplement (BOOST HIGH PROTEIN) LIQD Take 1 Container by mouth 2 (two) times daily between meals.   furosemide (LASIX) 40 MG tablet TAKE 1 TABLET BY MOUTH DAILY (Patient taking differently: Take 20 mg by mouth daily.)   nebivolol (BYSTOLIC) 10 MG tablet TAKE 1/2 A TABLET BY MOUTH TWICE A DAY   nitroGLYCERIN (NITROSTAT) 0.4 MG SL tablet Place 1 tablet (0.4 mg total) under the tongue every 5 (five) minutes x 3 doses as needed for chest pain.   No facility-administered encounter medications on file as of 04/13/2023.    Allergies (verified) Patient has no known allergies.   History: Past Medical History:  Diagnosis Date   Arthritis    CAD (coronary artery disease), native coronary artery    chronic rt coroanry artery occlusion   Essential hypertension 12/30/2015   GERD (gastroesophageal reflux disease)    Hyperlipidemia LDL goal <70 12/30/2015   S/P  angioplasty with stent 12/28/2015   DES to LAD   ST elevation myocardial infarction (STEMI) of anterior wall (HCC) 12/28/2015   Past Surgical History:  Procedure Laterality Date   BALLOON ANGIOPLASTY, ARTERY     73 or 89 years old   CARDIAC CATHETERIZATION N/A 12/28/2015   Procedure: Left Heart Cath and Coronary Angiography;  Surgeon: Tonny Bollman, MD;  Location: [facility] INVASIVE CV LAB;  Service: Cardiovascular;  Laterality: N/A;   CARDIAC CATHETERIZATION N/A 12/28/2015   Procedure: Coronary Stent Intervention;  Surgeon: Tonny Bollman, MD;  Location: [facility] INVASIVE CV LAB;  Service: Cardiovascular;  Laterality: N/A;   Family History  Problem Relation Age of Onset   Transient ischemic attack Mother 72   Heart attack Father 38   Heart attack Sister    Heart attack Sister    Heart attack Sister    Heart attack Brother    Heart attack Brother    Heart attack Brother    Social History   Socioeconomic History   Marital status: Widowed    Spouse name: Not on file   Number of children: 2   Years of education: Not on file   Highest education level: Not on file  Occupational History   Not on file  Tobacco Use   Smoking status: Never   Smokeless tobacco: Never  Vaping Use   Vaping status: Never Used  Substance and Sexual  Activity   Alcohol use: No   Drug use: No   Sexual activity: Never  Other Topics Concern   Not on file  Social History Narrative   Not on file   Social Determinants of Health   Financial Resource Strain: Low Risk  (04/13/2023)   Overall Financial Resource Strain (CARDIA)    Difficulty of Paying Living Expenses: Not hard at all  Food Insecurity: No Food Insecurity (04/13/2023)   Hunger Vital Sign    Worried About Running Out of Food in the Last Year: Never true    Ran Out of Food in the Last Year: Never true  Transportation Needs: No Transportation Needs (04/13/2023)   PRAPARE - Administrator, Civil Service (Medical): No    Lack of Transportation  (Non-Medical): No  Physical Activity: Inactive (04/13/2023)   Exercise Vital Sign    Days of Exercise per Week: 0 days    Minutes of Exercise per Session: 0 min  Stress: No Stress Concern Present (04/13/2023)   [employer] of Occupational Health - Occupational Stress Questionnaire    Feeling of Stress : Not at all  Social Connections: Socially Isolated (04/13/2023)   Social Connection and Isolation Panel [NHANES]    Frequency of Communication with Friends and Family: More than three times a week    Frequency of Social Gatherings with Friends and Family: More than three times a week    Attends Religious Services: Never    Database administrator or Organizations: No    Attends Banker Meetings: Never    Marital Status: Widowed    Tobacco Counseling Counseling given: Not Answered   Clinical Intake:  Pre-visit preparation completed: Yes  Pain : No/denies pain     BMI - recorded: 20.12 Nutritional Status: BMI of 19-24  Normal Nutritional Risks: None Diabetes: No  How often do you need to have someone help you when you read instructions, pamphlets, or other written materials from your doctor or pharmacy?: 1 - Never What is the last grade level you completed in school?: High school unknown which grade level.  Interpreter Needed?: No      Activities of Daily Living    04/13/2023    2:27 PM  In your present state of health, do you have any difficulty performing the following activities:  Hearing? 0  Vision? 1  Difficulty concentrating or making decisions? 0  Walking or climbing stairs? 1  Dressing or bathing? 0  Doing errands, shopping? 1  Preparing Food and eating ? Y  Using the Toilet? N  In the past six months, have you accidently leaked urine? N  Do you have problems with loss of bowel control? N  Managing your Medications? Y  Managing your Finances? N  Housekeeping or managing your Housekeeping? Y    Patient Care Team: Blane Ohara, MD as PCP  - General (Family Medicine) Tonny Bollman, MD as PCP - Cardiology (Cardiology) Kennon Rounds as Physician Assistant (Cardiology)  Indicate any recent Medical Services you may have received from other than [facility] providers in the past year (date may be approximate).     Assessment:   This is a routine wellness examination for [place].  Hearing/Vision screen No results found.  Dietary issues and exercise activities discussed:     Goals Addressed             This Visit's Progress    Plan meals       Prevent falls  Depression Screen    04/13/2023    2:18 PM 03/03/2023    1:42 PM 05/06/2021    1:35 PM  PHQ 2/9 Scores  PHQ - 2 Score 0 0 0    Fall Risk    04/13/2023    2:18 PM 03/03/2023    1:46 PM 05/06/2021    1:35 PM  Fall Risk   Falls in the past year? 0 1 1  Number falls in past yr: 0 1 0  Injury with Fall? 0 0 0  Risk for fall due to : No Fall Risks No Fall Risks   Follow up Falls evaluation completed Falls evaluation completed     MEDICARE RISK AT HOME: Medicare Risk at Home Any stairs in or around the home?: No If so, are there any without handrails?: Yes Home free of loose throw rugs in walkways, pet beds, electrical cords, etc?: Yes Adequate lighting in your home to reduce risk of falls?: Yes Life alert?: Yes Use of a cane, walker or w/c?: Yes Grab bars in the bathroom?: Yes Shower chair or bench in shower?: Yes Elevated toilet seat or a handicapped toilet?: Yes (Toilet chair over the toilet.)  TIMED UP AND GO:  Was the test performed?  No    Cognitive Function:    03/03/2023    2:20 PM  MMSE - Mini Mental State Exam  Orientation to time 2  Orientation to Place 3  Registration 3  Attention/ Calculation 1  Recall 3  Language- name 2 objects 2  Language- repeat 1  Language- follow 3 step command 3  Language- read & follow direction 0  Language-read & follow direction-comments Patient can't see very well any more even with glasses.   Write a sentence 0  Write a sentence-comments Patient not able to draw and or write any more.  Copy design 0  Copy design-comments Patient not able to draw and or write any more.  Total score 18        04/13/2023    2:37 PM  6CIT Screen  What Year? 4 points  What month? 3 points  What time? 3 points  Count back from 20 0 points  Months in reverse 0 points  Repeat phrase 0 points  Total Score 10 points    Immunizations Immunization History  Administered Date(s) Administered   Fluad Quad(high Dose 65+) 05/06/2021    TDAP status: Due, Education has been provided regarding the importance of this vaccine. Advised may receive this vaccine at local pharmacy or Health Dept. Aware to provide a copy of the vaccination record if obtained from local pharmacy or Health Dept. Verbalized acceptance and understanding.  Flu Vaccine status: Due, Education has been provided regarding the importance of this vaccine. Advised may receive this vaccine at local pharmacy or Health Dept. Aware to provide a copy of the vaccination record if obtained from local pharmacy or Health Dept. Verbalized acceptance and understanding.  Pneumococcal vaccine status: Declined,  Education has been provided regarding the importance of this vaccine but patient still declined. Advised may receive this vaccine at local pharmacy or Health Dept. Aware to provide a copy of the vaccination record if obtained from local pharmacy or Health Dept. Verbalized acceptance and understanding.   Covid-19 vaccine status: Declined, Education has been provided regarding the importance of this vaccine but patient still declined. Advised may receive this vaccine at local pharmacy or Health Dept.or vaccine clinic. Aware to provide a copy of the vaccination record if obtained from local  pharmacy or Health Dept. Verbalized acceptance and understanding.  Qualifies for Shingles Vaccine? Yes     Screening Tests Health Maintenance  Topic Date Due    DTaP/Tdap/Td (1 - Tdap) Never done   Zoster Vaccines- Shingrix (1 of 2) Never done   Pneumonia Vaccine 38+ Years old (1 of 1 - PCV) Never done   DEXA SCAN  Never done   COVID-19 Vaccine (1 - 2023-24 season) Never done   INFLUENZA VACCINE  03/18/2023   Medicare Annual Wellness (AWV)  04/12/2024   HPV VACCINES  Aged Out    Health Maintenance  Health Maintenance Due  Topic Date Due   DTaP/Tdap/Td (1 - Tdap) Never done   Zoster Vaccines- Shingrix (1 of 2) Never done   Pneumonia Vaccine 49+ Years old (1 of 1 - PCV) Never done   DEXA SCAN  Never done   COVID-19 Vaccine (1 - 2023-24 season) Never done   INFLUENZA VACCINE  03/18/2023    Colonoscopy no longer required aged out.  Mammogram status: No longer required due to age.  Bone Density: Declined    Additional Screening:  Hepatitis C Screening: does not qualify;  Vision Screening: Recommended annual ophthalmology exams for early detection of glaucoma and other disorders of the eye. Is the patient up to date with their annual eye exam?   N/A    Dental Screening: Recommended annual dental exams for proper oral hygiene    Community Resource Referral / Chronic Care Management: CRR required this visit?  No   CCM required this visit?  No     Plan:     I have personally reviewed and noted the following in the patient's chart:   Medical and social history Use of alcohol, tobacco or illicit drugs  Current medications and supplements including opioid prescriptions. Patient is not currently taking opioid prescriptions. Functional ability and status Nutritional status Physical activity Advanced directives List of other physicians Hospitalizations, surgeries, and ER visits in previous 12 months Vitals Screenings to include cognitive, depression, and falls Referrals and appointments  In addition, I have reviewed and discussed with patient certain preventive protocols, quality metrics, and best practice  recommendations. A written personalized care plan for preventive services as well as general preventive health recommendations were provided to patient.     Windell Moment, MD   04/13/2023   After Visit Summary: printed  Nurse Notes:

## 2023-04-13 NOTE — Patient Instructions (Signed)
  Ms. Talluto , Thank you for taking time to come for your Medicare Wellness Visit. I appreciate your ongoing commitment to your health goals. Please review the following plan we discussed and let me know if I can assist you in the future.   These are the goals we discussed:  Goals      Plan meals     Prevent falls        This is a list of the screening recommended for you and due dates:  Health Maintenance  Topic Date Due   DTaP/Tdap/Td vaccine (1 - Tdap) Never done   Zoster (Shingles) Vaccine (1 of 2) Never done   Pneumonia Vaccine (1 of 1 - PCV) Never done   DEXA scan (bone density measurement)  Never done   COVID-19 Vaccine (1 - 2023-24 season) Never done   Flu Shot  03/18/2023   Medicare Annual Wellness Visit  04/12/2024   HPV Vaccine  Aged Out

## 2023-05-12 ENCOUNTER — Other Ambulatory Visit: Payer: Self-pay | Admitting: Cardiovascular Disease

## 2023-06-14 ENCOUNTER — Observation Stay (HOSPITAL_COMMUNITY)
Admission: EM | Admit: 2023-06-14 | Discharge: 2023-06-15 | Disposition: A | Discharge status: Home / Self Care | Payer: Medicare Other | Attending: Internal Medicine | Admitting: Internal Medicine

## 2023-06-14 ENCOUNTER — Emergency Department (HOSPITAL_COMMUNITY): Payer: Medicare Other

## 2023-06-14 ENCOUNTER — Other Ambulatory Visit: Payer: Self-pay

## 2023-06-14 DIAGNOSIS — I4891 Unspecified atrial fibrillation: Secondary | ICD-10-CM | POA: Diagnosis not present

## 2023-06-14 DIAGNOSIS — I2510 Atherosclerotic heart disease of native coronary artery without angina pectoris: Secondary | ICD-10-CM | POA: Insufficient documentation

## 2023-06-14 DIAGNOSIS — I499 Cardiac arrhythmia, unspecified: Secondary | ICD-10-CM | POA: Diagnosis not present

## 2023-06-14 DIAGNOSIS — Z955 Presence of coronary angioplasty implant and graft: Secondary | ICD-10-CM | POA: Diagnosis not present

## 2023-06-14 DIAGNOSIS — R339 Retention of urine, unspecified: Secondary | ICD-10-CM | POA: Diagnosis not present

## 2023-06-14 DIAGNOSIS — R55 Syncope and collapse: Secondary | ICD-10-CM | POA: Diagnosis present

## 2023-06-14 DIAGNOSIS — K7689 Other specified diseases of liver: Secondary | ICD-10-CM | POA: Diagnosis not present

## 2023-06-14 DIAGNOSIS — R0602 Shortness of breath: Secondary | ICD-10-CM | POA: Diagnosis not present

## 2023-06-14 DIAGNOSIS — I5020 Unspecified systolic (congestive) heart failure: Secondary | ICD-10-CM | POA: Diagnosis present

## 2023-06-14 DIAGNOSIS — F05 Delirium due to known physiological condition: Secondary | ICD-10-CM | POA: Insufficient documentation

## 2023-06-14 DIAGNOSIS — K529 Noninfective gastroenteritis and colitis, unspecified: Principal | ICD-10-CM | POA: Insufficient documentation

## 2023-06-14 DIAGNOSIS — I10 Essential (primary) hypertension: Secondary | ICD-10-CM | POA: Diagnosis not present

## 2023-06-14 DIAGNOSIS — Z79899 Other long term (current) drug therapy: Secondary | ICD-10-CM | POA: Insufficient documentation

## 2023-06-14 DIAGNOSIS — Z743 Need for continuous supervision: Secondary | ICD-10-CM | POA: Diagnosis not present

## 2023-06-14 DIAGNOSIS — R109 Unspecified abdominal pain: Secondary | ICD-10-CM | POA: Diagnosis not present

## 2023-06-14 DIAGNOSIS — I130 Hypertensive heart and chronic kidney disease with heart failure and stage 1 through stage 4 chronic kidney disease, or unspecified chronic kidney disease: Secondary | ICD-10-CM | POA: Diagnosis not present

## 2023-06-14 DIAGNOSIS — I700 Atherosclerosis of aorta: Secondary | ICD-10-CM | POA: Diagnosis not present

## 2023-06-14 DIAGNOSIS — N184 Chronic kidney disease, stage 4 (severe): Secondary | ICD-10-CM | POA: Insufficient documentation

## 2023-06-14 DIAGNOSIS — Z7902 Long term (current) use of antithrombotics/antiplatelets: Secondary | ICD-10-CM | POA: Insufficient documentation

## 2023-06-14 DIAGNOSIS — R569 Unspecified convulsions: Secondary | ICD-10-CM | POA: Diagnosis not present

## 2023-06-14 DIAGNOSIS — I5022 Chronic systolic (congestive) heart failure: Secondary | ICD-10-CM | POA: Insufficient documentation

## 2023-06-14 DIAGNOSIS — R6889 Other general symptoms and signs: Secondary | ICD-10-CM | POA: Diagnosis not present

## 2023-06-14 HISTORY — DX: Noninfective gastroenteritis and colitis, unspecified: K52.9

## 2023-06-14 LAB — CBC WITH DIFFERENTIAL/PLATELET
Abs Immature Granulocytes: 0.05 10*3/uL (ref 0.00–0.07)
Basophils Absolute: 0 10*3/uL (ref 0.0–0.1)
Basophils Relative: 0 %
Eosinophils Absolute: 0.1 10*3/uL (ref 0.0–0.5)
Eosinophils Relative: 1 %
HCT: 32.9 % — ABNORMAL LOW (ref 36.0–46.0)
Hemoglobin: 10.5 g/dL — ABNORMAL LOW (ref 12.0–15.0)
Immature Granulocytes: 1 %
Lymphocytes Relative: 8 %
Lymphs Abs: 0.8 10*3/uL (ref 0.7–4.0)
MCH: 30.9 pg (ref 26.0–34.0)
MCHC: 31.9 g/dL (ref 30.0–36.0)
MCV: 96.8 fL (ref 80.0–100.0)
Monocytes Absolute: 0.5 10*3/uL (ref 0.1–1.0)
Monocytes Relative: 5 %
Neutro Abs: 8.2 10*3/uL — ABNORMAL HIGH (ref 1.7–7.7)
Neutrophils Relative %: 85 %
Platelets: 159 10*3/uL (ref 150–400)
RBC: 3.4 MIL/uL — ABNORMAL LOW (ref 3.87–5.11)
RDW: 14 % (ref 11.5–15.5)
WBC: 9.6 10*3/uL (ref 4.0–10.5)
nRBC: 0 % (ref 0.0–0.2)

## 2023-06-14 LAB — URINALYSIS, ROUTINE W REFLEX MICROSCOPIC
Bilirubin Urine: NEGATIVE
Glucose, UA: NEGATIVE mg/dL
Hgb urine dipstick: NEGATIVE
Ketones, ur: NEGATIVE mg/dL
Leukocytes,Ua: NEGATIVE
Nitrite: NEGATIVE
Protein, ur: NEGATIVE mg/dL
Specific Gravity, Urine: 1.008 (ref 1.005–1.030)
pH: 6 (ref 5.0–8.0)

## 2023-06-14 LAB — LIPASE, BLOOD: Lipase: 35 U/L (ref 11–51)

## 2023-06-14 LAB — COMPREHENSIVE METABOLIC PANEL
ALT: 10 U/L (ref 0–44)
AST: 28 U/L (ref 15–41)
Albumin: 3.4 g/dL — ABNORMAL LOW (ref 3.5–5.0)
Alkaline Phosphatase: 96 U/L (ref 38–126)
Anion gap: 16 — ABNORMAL HIGH (ref 5–15)
BUN: 42 mg/dL — ABNORMAL HIGH (ref 8–23)
CO2: 19 mmol/L — ABNORMAL LOW (ref 22–32)
Calcium: 8.6 mg/dL — ABNORMAL LOW (ref 8.9–10.3)
Chloride: 101 mmol/L (ref 98–111)
Creatinine, Ser: 2.39 mg/dL — ABNORMAL HIGH (ref 0.44–1.00)
GFR, Estimated: 18 mL/min — ABNORMAL LOW (ref >60)
Glucose, Bld: 150 mg/dL — ABNORMAL HIGH (ref 70–99)
Potassium: 4.1 mmol/L (ref 3.5–5.1)
Sodium: 136 mmol/L (ref 135–145)
Total Bilirubin: 0.5 mg/dL (ref 0.3–1.2)
Total Protein: 6.6 g/dL (ref 6.5–8.1)

## 2023-06-14 LAB — TROPONIN I (HIGH SENSITIVITY)
Troponin I (High Sensitivity): 30 ng/L — ABNORMAL HIGH (ref <18)
Troponin I (High Sensitivity): 38 ng/L — ABNORMAL HIGH (ref <18)

## 2023-06-14 LAB — MAGNESIUM: Magnesium: 2.4 mg/dL (ref 1.7–2.4)

## 2023-06-14 MED ORDER — ONDANSETRON HCL 4 MG PO TABS: 4.0000 mg | ORAL_TABLET | Freq: Four times a day (QID) | ORAL | Status: DC | PRN

## 2023-06-14 MED ORDER — SODIUM CHLORIDE 0.9 % IV BOLUS
500.0000 mL | Freq: Once | INTRAVENOUS | Status: AC
  Administered 2023-06-14: 500 mL via INTRAVENOUS

## 2023-06-14 MED ORDER — FAMOTIDINE 20 MG PO TABS
20.0000 mg | ORAL_TABLET | Freq: Every day | ORAL | Status: DC
  Administered 2023-06-15: 20 mg via ORAL
  Filled 2023-06-14: qty 1

## 2023-06-14 MED ORDER — ATORVASTATIN CALCIUM 80 MG PO TABS
80.0000 mg | ORAL_TABLET | Freq: Every day | ORAL | Status: DC
  Administered 2023-06-15: 80 mg via ORAL
  Filled 2023-06-14: qty 2

## 2023-06-14 MED ORDER — BOOST HIGH PROTEIN PO LIQD
1.0000 | Freq: Two times a day (BID) | ORAL | Status: DC
  Administered 2023-06-15: 237 mL via ORAL
  Filled 2023-06-14: qty 237

## 2023-06-14 MED ORDER — ONDANSETRON HCL 4 MG/2ML IJ SOLN: 4.0000 mg | Freq: Four times a day (QID) | INTRAMUSCULAR | Status: DC | PRN

## 2023-06-14 MED ORDER — ONDANSETRON HCL 4 MG/2ML IJ SOLN
4.0000 mg | Freq: Once | INTRAMUSCULAR | Status: AC
  Administered 2023-06-14: 4 mg via INTRAVENOUS
  Filled 2023-06-14: qty 2

## 2023-06-14 MED ORDER — ACETAMINOPHEN 500 MG PO TABS
1000.0000 mg | ORAL_TABLET | Freq: Once | ORAL | Status: AC
  Administered 2023-06-14: 1000 mg via ORAL
  Filled 2023-06-14: qty 2

## 2023-06-14 MED ORDER — CLOPIDOGREL BISULFATE 75 MG PO TABS
75.0000 mg | ORAL_TABLET | Freq: Every day | ORAL | Status: DC
  Administered 2023-06-15: 75 mg via ORAL
  Filled 2023-06-14: qty 1

## 2023-06-14 MED ORDER — SODIUM CHLORIDE 0.9 % IV SOLN
1.5000 g | Freq: Every day | INTRAVENOUS | Status: DC
  Filled 2023-06-14: qty 4

## 2023-06-14 MED ORDER — ACETAMINOPHEN 650 MG RE SUPP: 650.0000 mg | Freq: Four times a day (QID) | RECTAL | Status: DC | PRN

## 2023-06-14 MED ORDER — ACETAMINOPHEN 325 MG PO TABS: 650.0000 mg | ORAL_TABLET | Freq: Four times a day (QID) | ORAL | Status: DC | PRN

## 2023-06-14 MED ORDER — HEPARIN SODIUM (PORCINE) 5000 UNIT/ML IJ SOLN
5000.0000 [IU] | Freq: Three times a day (TID) | INTRAMUSCULAR | Status: DC
  Administered 2023-06-15: 5000 [IU] via SUBCUTANEOUS
  Filled 2023-06-14: qty 1

## 2023-06-14 MED ORDER — OXYCODONE HCL 5 MG PO TABS
2.5000 mg | ORAL_TABLET | Freq: Once | ORAL | Status: AC
  Administered 2023-06-14: 2.5 mg via ORAL
  Filled 2023-06-14: qty 1

## 2023-06-14 MED ORDER — NEBIVOLOL HCL 5 MG PO TABS
5.0000 mg | ORAL_TABLET | Freq: Two times a day (BID) | ORAL | Status: DC
  Administered 2023-06-15: 5 mg via ORAL
  Filled 2023-06-14 (×2): qty 1

## 2023-06-14 MED ORDER — SODIUM CHLORIDE 0.9 % IV SOLN
3.0000 g | Freq: Once | INTRAVENOUS | Status: AC
  Administered 2023-06-14: 3 g via INTRAVENOUS
  Filled 2023-06-14: qty 8

## 2023-06-14 MED ORDER — LACTATED RINGERS IV SOLN: INTRAVENOUS | Status: AC

## 2023-06-14 MED ORDER — SODIUM CHLORIDE 0.9% FLUSH
3.0000 mL | Freq: Two times a day (BID) | INTRAVENOUS | Status: DC
  Administered 2023-06-15: 3 mL via INTRAVENOUS

## 2023-06-14 NOTE — Hospital Course (Signed)
HPI: Sophia Snyder is a 89 y.o. female with medical history significant for CAD s/p DES to LAD in 2017, chronic HFrEF (EF 30-35% 02/2019), CKD stage IV, HTN, HLD who presented to the ED for evaluation of diarrhea and syncopal event.  History is supplemented by daughter and grandson at bedside.   Patient lives with her grandson who helps care for her.  She developed diarrhea 2 days ago which has been slowly improving.  Stools have been more formed however she has not been having much bowel movements today due to poor oral intake.  When she went to use the bathroom she had a brief syncopal episode while on the toilet.  She did not fall or hit her head.   EMS were called to the home.  Per ED documentation patient was noted to have rapid heart rate in the 160s.  There was concern for A-fib with RVR.  Patient was given 10 mg diltiazem and 500 cc normal saline.  Tachyarrhythmia resolved by the time patient arrived to the ED.  There were no rhythm strips for review.   Patient has not had any recent changes in her medications or recent antibiotic use.  Denies any potential dietary cause of her GI illness.  Normally ambulates with use of a walker.   ED Course  Labs/Imaging on admission: I have personally reviewed following labs and imaging studies.   Initial vitals showed BP 111/69, pulse 84, RR 16, temp 96.3 F, SpO2 96% on room air.   Labs show WBC 9.6, hemoglobin 10.5, platelets 159,000, sodium 136, potassium 4.1, bicarb 19, BUN 42, creatinine 2.39, serum glucose 150, LFTs within normal limits, troponin 30 > 38, magnesium 2.4, lipase 35.  Urinalysis pending collection.   Portable chest x-ray showed mildly enlarged cardiac silhouette, chronic interstitial changes.  Calcified aorta.   CT abdomen/pelvis showed segmental distal colonic wall thickening consistent with inflammatory, infectious, or ischemic colitis.  Trace bilateral pleural effusions noted.   Patient was given 500 cc normal saline, IV Unasyn,  Zofran, Oxy IR, Tylenol.  The hospitalist service was consulted to admit for further evaluation and management.  Significant Events: Admitted 06/14/2023 for syncope and colitis   Significant Labs: Admit Scr 2.39, BUN 42  Significant Imaging Studies: CT abd/pelvis shows Segmental distal colonic wall thickening consistent with inflammatory, infectious, or ischemic colitis.  Antibiotic Therapy: Anti-infectives (From admission, onward)    Start     Dose/Rate Route Frequency Ordered Stop   06/15/23 2300  ampicillin-sulbactam (UNASYN) 1.5 g in sodium chloride 0.9 % 100 mL IVPB  Status:  Discontinued        1.5 g 200 mL/hr over 30 Minutes Intravenous Daily 06/14/23 2338 06/15/23 1433   06/15/23 1530  metroNIDAZOLE (FLAGYL) tablet 500 mg        500 mg Oral Every 12 hours 06/15/23 1433     06/15/23 0000  metroNIDAZOLE (FLAGYL) 500 MG tablet       Note to Pharmacy: Bring to bedside   500 mg Oral Every 12 hours 06/15/23 1449 06/22/23 2359   06/15/23 0000  cefdinir (OMNICEF) 300 MG capsule       Note to Pharmacy: Bring to bedside   300 mg Oral Daily 06/15/23 1449 06/20/23 2359   06/14/23 2300  Ampicillin-Sulbactam (UNASYN) 3 g in sodium chloride 0.9 % 100 mL IVPB        3 g 200 mL/hr over 30 Minutes Intravenous  Once 06/14/23 2255 06/14/23 2339       Procedures:  Consultants:

## 2023-06-14 NOTE — ED Provider Notes (Signed)
Received patient in turnover from Dr. Wilkie Aye.  Please see their note for further details of Hx, PE.  Briefly patient is a 89 y.o. female with a Afib RvR .  Diarrhea for a couple days.  Syncopal event while on the toilet.  EMS with concern for atrial fib with RVR rates in the 160's.  Resolved.  Plan for admission post CT.  CT scan with possible colitis.  Will start on antibiotics.  Will discuss with medicine for admission.    Melene Plan, DO 06/14/23 2256

## 2023-06-14 NOTE — ED Notes (Signed)
ED TO INPATIENT HANDOFF REPORT  ED Nurse Name and Phone #: Lucious Groves [phone]  S Name/Age/Gender Sophia Snyder 89 y.o. female Room/Bed: TRAAC/TRAAC  Code Status   Code Status: Limited: Do not attempt resuscitation (DNR) -DNR-LIMITED -Do Not Intubate/DNI   Home/SNF/Other Home Patient oriented to: self Is this baseline? Yes   Triage Complete: Triage complete  Chief Complaint Colitis [K52.9]  Triage Note Pt BIB [facility] from home. EMS initially called for seizure like activity. Family reports it lasted about 5 seconds. Upon arrival EMS reports a 150-177 HR. Pt c/o of N/V and generalized weakness. Family denies any hx of seizures. Hx of dementia. EMS gave 10 mg of Cardizem and 500 NS. One episode of emesis w/ EMS. GCS 14  EMS: 122/46 164 CBG 2L [place] 93%  18 LAC 20 RAC   Allergies No Known Allergies  Level of Care/Admitting Diagnosis ED Disposition     ED Disposition  Admit   Condition  --   Comment  Hospital [facility]: [facility] [[id number]]  Level of Care: Telemetry Cardiac [103]  May place patient in observation at [facility] or [facility] if equivalent level of care is available:: No  Covid Evaluation: Asymptomatic - no recent exposure (last 10 days) testing not required  Diagnosis: Colitis [[id number]]  Admitting Physician: Charlsie Quest [[id number]]  Attending Physician: Charlsie Quest [[id number]]          B Medical/Surgery History Past Medical History:  Diagnosis Date   Arthritis    CAD (coronary artery disease), native coronary artery    chronic rt coroanry artery occlusion   Essential hypertension 12/30/2015   GERD (gastroesophageal reflux disease)    Hyperlipidemia LDL goal <70 12/30/2015   S/P angioplasty with stent 12/28/2015   DES to LAD   ST elevation myocardial infarction (STEMI) of anterior wall (HCC) 12/28/2015   Past Surgical History:  Procedure Laterality Date   BALLOON ANGIOPLASTY, ARTERY     69 or 74 years old   CARDIAC  CATHETERIZATION N/A 12/28/2015   Procedure: Left Heart Cath and Coronary Angiography;  Surgeon: Tonny Bollman, MD;  Location: [facility] INVASIVE CV LAB;  Service: Cardiovascular;  Laterality: N/A;   CARDIAC CATHETERIZATION N/A 12/28/2015   Procedure: Coronary Stent Intervention;  Surgeon: Tonny Bollman, MD;  Location: [facility] INVASIVE CV LAB;  Service: Cardiovascular;  Laterality: N/A;     A IV Location/Drains/Wounds Patient Lines/Drains/Airways Status     Active Line/Drains/Airways     Name Placement date Placement time Site Days   Peripheral IV 06/14/23 18 G Left Forearm 06/14/23  --  Forearm  less than 1   Peripheral IV 06/14/23 20 G Anterior;Right Forearm 06/14/23  --  Forearm  less than 1   External Urinary Catheter 06/14/23  1537  --  less than 1            Intake/Output Last 24 hours  Intake/Output Summary (Last 24 hours) at 06/14/2023 2340 Last data filed at 06/14/2023 2339 Gross per 24 hour  Intake 664.4 ml  Output 550 ml  Net 114.4 ml    Labs/Imaging Results for orders placed or performed during the hospital encounter of 06/14/23 (from the past 48 hour(s))  CBC with Differential     Status: Abnormal   Collection Time: 06/14/23  1:53 PM  Result Value Ref Range   WBC 9.6 4.0 - 10.5 K/uL   RBC 3.40 (L) 3.87 - 5.11 MIL/uL   Hemoglobin 10.5 (L) 12.0 - 15.0 g/dL  HCT 32.9 (L) 36.0 - 46.0 %   MCV 96.8 80.0 - 100.0 fL   MCH 30.9 26.0 - 34.0 pg   MCHC 31.9 30.0 - 36.0 g/dL   RDW 78.4 69.6 - 29.5 %   Platelets 159 150 - 400 K/uL   nRBC 0.0 0.0 - 0.2 %   Neutrophils Relative % 85 %   Neutro Abs 8.2 (H) 1.7 - 7.7 K/uL   Lymphocytes Relative 8 %   Lymphs Abs 0.8 0.7 - 4.0 K/uL   Monocytes Relative 5 %   Monocytes Absolute 0.5 0.1 - 1.0 K/uL   Eosinophils Relative 1 %   Eosinophils Absolute 0.1 0.0 - 0.5 K/uL   Basophils Relative 0 %   Basophils Absolute 0.0 0.0 - 0.1 K/uL   Immature Granulocytes 1 %   Abs Immature Granulocytes 0.05 0.00 - 0.07 K/uL    Comment:  Performed at [facility], [address] [place], [place] [address]  Comprehensive metabolic panel     Status: Abnormal   Collection Time: 06/14/23  1:53 PM  Result Value Ref Range   Sodium 136 135 - 145 mmol/L   Potassium 4.1 3.5 - 5.1 mmol/L   Chloride 101 98 - 111 mmol/L   CO2 19 (L) 22 - 32 mmol/L   Glucose, Bld 150 (H) 70 - 99 mg/dL    Comment: Glucose reference range applies only to samples taken after fasting for at least 8 hours.   BUN 42 (H) 8 - 23 mg/dL   Creatinine, Ser 2.44 (H) 0.44 - 1.00 mg/dL   Calcium 8.6 (L) 8.9 - 10.3 mg/dL   Total Protein 6.6 6.5 - 8.1 g/dL   Albumin 3.4 (L) 3.5 - 5.0 g/dL   AST 28 15 - 41 U/L   ALT 10 0 - 44 U/L   Alkaline Phosphatase 96 38 - 126 U/L   Total Bilirubin 0.5 0.3 - 1.2 mg/dL   GFR, Estimated 18 (L) >60 mL/min    Comment: (NOTE) Calculated using the CKD-EPI Creatinine Equation (2021)    Anion gap 16 (H) 5 - 15    Comment: Performed at [facility], [address], [place], [place] [address]  Troponin I (High Sensitivity)     Status: Abnormal   Collection Time: 06/14/23  1:53 PM  Result Value Ref Range   Troponin I (High Sensitivity) 30 (H) <18 ng/L    Comment: (NOTE) Elevated high sensitivity troponin I (hsTnI) values and significant  changes across serial measurements may suggest ACS but many other  chronic and acute conditions are known to elevate hsTnI results.  Refer to the "Links" section for chest pain algorithms and additional  guidance. Performed at [facility], [address], [place], [place] [address]   Magnesium     Status: None   Collection Time: 06/14/23  1:53 PM  Result Value Ref Range   Magnesium 2.4 1.7 - 2.4 mg/dL    Comment: Performed at [facility], [address], [place], [place] [address]  Lipase, blood     Status: None   Collection Time: 06/14/23  1:53 PM  Result Value Ref Range   Lipase 35 11 - 51 U/L    Comment: Performed at [facility], [address]  [place], [place] [address]  Troponin I (High Sensitivity)     Status: Abnormal   Collection Time: 06/14/23  4:28 PM  Result Value Ref Range   Troponin I (High Sensitivity)  38 (H) <18 ng/L    Comment: (NOTE) Elevated high sensitivity troponin I (hsTnI) values and significant  changes across serial measurements may suggest ACS but many other  chronic and acute conditions are known to elevate hsTnI results.  Refer to the "Links" section for chest pain algorithms and additional  guidance. Performed at [facility], [address], [place], [place] [address]    CT ABDOMEN PELVIS WO CONTRAST  Result Date: 06/14/2023 CLINICAL DATA:  Abdominal pain, atrial fibrillation EXAM: CT ABDOMEN AND PELVIS WITHOUT CONTRAST TECHNIQUE: Multidetector CT imaging of the abdomen and pelvis was performed following the standard protocol without IV contrast. Unenhanced CT was performed per clinician order. Lack of IV contrast limits sensitivity and specificity, especially for evaluation of abdominal/pelvic solid viscera. RADIATION DOSE REDUCTION: This exam was performed according to the departmental dose-optimization program which includes automated exposure control, adjustment of the mA and/or kV according to patient size and/or use of iterative reconstruction technique. COMPARISON:  None Available. FINDINGS: Lower chest: Trace bilateral pleural effusions. Cardiomegaly without significant pericardial effusion. Hepatobiliary: 1.7 cm right lobe liver cyst. Otherwise unremarkable appearance of the liver and gallbladder. Pancreas: Unremarkable unenhanced appearance. Spleen: Unremarkable unenhanced appearance. Adrenals/Urinary Tract: No urinary tract calculi or obstructive uropathy within either kidney. The adrenals and bladder are unremarkable. Stomach/Bowel: No bowel obstruction or ileus. There is segmental colonic wall thickening extending from the splenic flexure through the sigmoid colon, compatible with inflammatory or  infectious colitis. Normal retrocecal appendix. Vascular/Lymphatic: Aortic atherosclerosis. No enlarged abdominal or pelvic lymph nodes. Reproductive: Uterus and bilateral adnexa are unremarkable. Other: No free fluid or free intraperitoneal gas. No abdominal wall hernia. Musculoskeletal: No acute or destructive bony abnormalities. Reconstructed images demonstrate no additional findings. IMPRESSION: 1. Segmental distal colonic wall thickening consistent with inflammatory, infectious, or ischemic colitis. 2. Trace bilateral pleural effusions. 3.  Aortic Atherosclerosis (ICD10-I70.0). Electronically Signed   By: Sharlet Salina M.D.   On: 06/14/2023 22:52   DG Chest Port 1 View  Result Date: 06/14/2023 CLINICAL DATA:  Shortness of breath. AFib with rapid ventricular response. EXAM: PORTABLE CHEST 1 VIEW COMPARISON:  X-ray 07/18/2021 FINDINGS: Calcified aorta. Borderline size heart. No consolidation, pneumothorax or effusion no edema. Interstitial changes are seen which could be chronic. Overlapping cardiac leads. Osteopenia degenerative changes. IMPRESSION: Mildly enlarged cardiac silhouette. Calcified aorta. Chronic interstitial changes. Electronically Signed   By: Karen Kays M.D.   On: 06/14/2023 17:13    Pending Labs Unresulted Labs (From admission, onward)     Start     Ordered   06/15/23 0500  CBC  Tomorrow morning,   R        06/14/23 2336   06/15/23 0500  Basic metabolic panel  Tomorrow morning,   R        06/14/23 2336   06/14/23 1428  Urinalysis, Routine w reflex microscopic -Urine, Clean Catch  Once,   URGENT       Question:  Specimen Source  Answer:  Urine, Clean Catch   06/14/23 1428            Vitals/Pain Today's Vitals   06/14/23 2130 06/14/23 2200 06/14/23 2213 06/14/23 2221  BP: 139/64 (!) 111/57    Pulse: 79 69    Resp:      Temp:   98.3 F (36.8 C)   TempSrc:   Oral   SpO2: 97% 96%    Weight:      Height:      PainSc:  Asleep    Isolation Precautions No  active isolations  Medications Medications  heparin injection 5,000 Units (has no administration in time range)  sodium chloride flush (NS) 0.9 % injection 3 mL (has no administration in time range)  acetaminophen (TYLENOL) tablet 650 mg (has no administration in time range)    Or  acetaminophen (TYLENOL) suppository 650 mg (has no administration in time range)  ondansetron (ZOFRAN) tablet 4 mg (has no administration in time range)    Or  ondansetron (ZOFRAN) injection 4 mg (has no administration in time range)  atorvastatin (LIPITOR) tablet 80 mg (has no administration in time range)  clopidogrel (PLAVIX) tablet 75 mg (has no administration in time range)  famotidine (PEPCID) tablet 20 mg (has no administration in time range)  feeding supplement (BOOST HIGH PROTEIN) liquid 237 mL (has no administration in time range)  nebivolol (BYSTOLIC) tablet 5 mg (has no administration in time range)  ampicillin-sulbactam (UNASYN) 1.5 g in sodium chloride 0.9 % 100 mL IVPB (has no administration in time range)  lactated ringers infusion (has no administration in time range)  ondansetron (ZOFRAN) injection 4 mg (4 mg Intravenous Given 06/14/23 1433)  sodium chloride 0.9 % bolus 500 mL (0 mLs Intravenous Stopped 06/14/23 1554)  acetaminophen (TYLENOL) tablet 1,000 mg (1,000 mg Oral Given 06/14/23 2105)  oxyCODONE (Oxy IR/ROXICODONE) immediate release tablet 2.5 mg (2.5 mg Oral Given 06/14/23 2105)  Ampicillin-Sulbactam (UNASYN) 3 g in sodium chloride 0.9 % 100 mL IVPB (0 g Intravenous Stopped 06/14/23 2339)    Mobility walks with device     Focused Assessments     R Recommendations: See Admitting Provider Note  Report given to:   Additional Notes:

## 2023-06-14 NOTE — H&P (Signed)
History and Physical    ZEYNAB PIKUS GEX:[id number] DOB: [birth year] DOA: 06/14/2023  PCP: Blane Ohara, MD  Patient coming from: Home  I have personally briefly reviewed patient's old medical records in [facility]  Chief Complaint: Syncope, diarrhea  HPI: MILIANNA Snyder is a 89 y.o. female with medical history significant for CAD s/p DES to LAD in 2017, chronic HFrEF (EF 30-35% 02/2019), CKD stage IV, HTN, HLD who presented to the ED for evaluation of diarrhea and syncopal event.  History is supplemented by daughter and grandson at bedside.  Patient lives with her grandson who helps care for her.  She developed diarrhea 2 days ago which has been slowly improving.  Stools have been more formed however she has not been having much bowel movements today due to poor oral intake.  When she went to use the bathroom she had a brief syncopal episode while on the toilet.  She did not fall or hit her head.  EMS were called to the home.  Per ED documentation patient was noted to have rapid heart rate in the 160s.  There was concern for A-fib with RVR.  Patient was given 10 mg diltiazem and 500 cc normal saline.  Tachyarrhythmia resolved by the time patient arrived to the ED.  There were no rhythm strips for review.  Patient has not had any recent changes in her medications or recent antibiotic use.  Denies any potential dietary cause of her GI illness.  Normally ambulates with use of a walker.  ED Course  Labs/Imaging on admission: I have personally reviewed following labs and imaging studies.  Initial vitals showed BP 111/69, pulse 84, RR 16, temp 96.3 F, SpO2 96% on room air.  Labs show WBC 9.6, hemoglobin 10.5, platelets 159,000, sodium 136, potassium 4.1, bicarb 19, BUN 42, creatinine 2.39, serum glucose 150, LFTs within normal limits, troponin 30 > 38, magnesium 2.4, lipase 35.  Urinalysis pending collection.  Portable chest x-ray showed mildly enlarged cardiac silhouette, chronic  interstitial changes.  Calcified aorta.  CT abdomen/pelvis showed segmental distal colonic wall thickening consistent with inflammatory, infectious, or ischemic colitis.  Trace bilateral pleural effusions noted.  Patient was given 500 cc normal saline, IV Unasyn, Zofran, Oxy IR, Tylenol.  The hospitalist service was consulted to admit for further evaluation and management.  Review of Systems: All systems reviewed and are negative except as documented in history of present illness above.   Past Medical History:  Diagnosis Date   Arthritis    CAD (coronary artery disease), native coronary artery    chronic rt coroanry artery occlusion   Essential hypertension 12/30/2015   GERD (gastroesophageal reflux disease)    Hyperlipidemia LDL goal <70 12/30/2015   S/P angioplasty with stent 12/28/2015   DES to LAD   ST elevation myocardial infarction (STEMI) of anterior wall (HCC) 12/28/2015    Past Surgical History:  Procedure Laterality Date   BALLOON ANGIOPLASTY, ARTERY     68 or 89 years old   CARDIAC CATHETERIZATION N/A 12/28/2015   Procedure: Left Heart Cath and Coronary Angiography;  Surgeon: Tonny Bollman, MD;  Location: [facility] INVASIVE CV LAB;  Service: Cardiovascular;  Laterality: N/A;   CARDIAC CATHETERIZATION N/A 12/28/2015   Procedure: Coronary Stent Intervention;  Surgeon: Tonny Bollman, MD;  Location: [facility] INVASIVE CV LAB;  Service: Cardiovascular;  Laterality: N/A;    Social History:  reports that she has never smoked. She has never used smokeless tobacco. She reports that she does not drink  alcohol and does not use drugs.  No Known Allergies  Family History  Problem Relation Age of Onset   Transient ischemic attack Mother 64   Heart attack Father 87   Heart attack Sister    Heart attack Sister    Heart attack Sister    Heart attack Brother    Heart attack Brother    Heart attack Brother      Prior to Admission medications   Medication Sig Start Date End Date Taking?  Authorizing Provider  atorvastatin (LIPITOR) 80 MG tablet TAKE 1 TABLET BY MOUTH DAILY AT 6 PM. 01/14/22  Yes Tonny Bollman, MD  clopidogrel (PLAVIX) 75 MG tablet TAKE 1 TABLET BY MOUTH DAILY 05/12/23  Yes Georgeanna Lea, MD  famotidine (PEPCID) 20 MG tablet TAKE 1 TABLET BY MOUTH DAILY. 05/12/23  Yes Georgeanna Lea, MD  feeding supplement (BOOST HIGH PROTEIN) LIQD Take 1 Container by mouth 2 (two) times daily between meals.   Yes [provider]  furosemide (LASIX) 40 MG tablet TAKE 1 TABLET BY MOUTH DAILY Patient taking differently: Take 20 mg by mouth daily. 10/02/22  Yes Tonny Bollman, MD  loperamide (IMODIUM) 1 MG/5ML solution Take 3-6 mg by mouth 2 (two) times daily as needed for diarrhea or loose stools.   Yes [provider]  nebivolol (BYSTOLIC) 10 MG tablet TAKE 1/2 A TABLET BY MOUTH TWICE A DAY 05/12/23  Yes Georgeanna Lea, MD  nitroGLYCERIN (NITROSTAT) 0.4 MG SL tablet Place 1 tablet (0.4 mg total) under the tongue every 5 (five) minutes x 3 doses as needed for chest pain. 03/22/17  Yes Tonny Bollman, MD    Physical Exam: Vitals:   06/14/23 2100 06/14/23 2130 06/14/23 2200 06/14/23 2213  BP: 134/68 139/64 (!) 111/57   Pulse: 79 79 69   Resp: 15     Temp:    98.3 F (36.8 C)  TempSrc:    Oral  SpO2: 100% 97% 96%   Weight:      Height:       Constitutional: Elderly woman resting in bed, no acute distress, appears comfortable Eyes: EOMI, lids and conjunctivae normal ENMT: Mucous membranes are dry. Posterior pharynx clear of any exudate or lesions.Normal dentition.  Neck: normal, supple, no masses. Respiratory: clear to auscultation bilaterally, no wheezing, no crackles. Normal respiratory effort. No accessory muscle use.  Cardiovascular: Regular rate and rhythm, no murmurs / rubs / gallops. No extremity edema. 2+ pedal pulses. Abdomen: Soft, no tenderness, no masses palpated. Musculoskeletal: no clubbing / cyanosis. No joint deformity upper and  lower extremities. Good ROM, no contractures. Normal muscle tone.  Skin: no rashes, lesions, ulcers. No induration Neurologic: Sensation intact. Strength 5/5 in all 4.  Psychiatric:  Alert and oriented x 3. Normal mood.   EKG: Personally reviewed. Sinus rhythm, rate 84, borderline prolonged PR interval, no acute ischemic changes.  Not significantly changed when compared to previous.  Assessment/Plan Principal Problem:   Colitis Active Problems:   HFrEF (heart failure with reduced ejection fraction) (HCC)   CAD (coronary artery disease)   Chronic kidney disease, stage 4 (severe) (HCC)   TRANIKA SUGGS is a 89 y.o. female with medical history significant for CAD s/p DES to LAD in 2017, chronic HFrEF (EF 30-35% 02/2019), CKD stage IV, HTN, HLD who is admitted with colitis.  Assessment and Plan: Colitis: CT imaging suggestive of colitis.  Family reports loose stools have been improving.  No further diarrhea today.  No leukocytosis.  No  recent antibiotic use or hospitalization. -Continue IV Unasyn -Continue gentle IV fluid hydration overnight -Advance diet as tolerated -If having recurrent watery diarrhea would send C. difficile and GI pathogen panels  Question of tachyarrhythmia PTA: EMS reported concern for A-fib with RVR prior to arrival.  This resolved after receiving diltiazem 10 mg and IV fluids.  No EKG strip to review.  Suspect tachycardia provoked from volume depletion in setting of acute GI illness. -Keep on telemetry overnight -IV fluid hydration as above -Continue home Bystolic 5 mg twice daily  Syncopal event: Likely vasovagal due to straining on toilet plus volume depletion.  Urinary retention: Patient has not voided since arrival.  Bladder scan shows 413 mL in the bladder. -In-N-Out cath once now and as needed -Bladder scan as needed  CKD stage IV: Renal function appears to be at baseline.  Labs and July showed creatinine 0.76 however all previous labs show creatinine  baseline 2.3-2.4.  Continue to monitor.  Chronic HFrEF: Hypovolemic due to GI losses. -Gentle IV fluid hydration as above -Continue Bystolic -Holding Lasix  CAD s/p DES to LAD 2017: Stable, denies chest pain.  Continue Plavix and atorvastatin.   DVT prophylaxis: heparin injection 5,000 Units Start: 06/15/23 0600 Code Status:   Code Status: Limited: Do not attempt resuscitation (DNR) -DNR-LIMITED -Do Not Intubate/DNI confirmed with family on admission. Family Communication: Daughter and grandson at bedside Disposition Plan: From home and likely discharge to home pending clinical progress Consults called: None Severity of Illness: The appropriate patient status for this patient is OBSERVATION. Observation status is judged to be reasonable and necessary in order to provide the required intensity of service to ensure the patient's safety. The patient's presenting symptoms, physical exam findings, and initial radiographic and laboratory data in the context of their medical condition is felt to place them at decreased risk for further clinical deterioration. Furthermore, it is anticipated that the patient will be medically stable for discharge from the hospital within 2 midnights of admission.   Darreld Mclean MD [facility]  If 7PM-7AM, please contact night-coverage [email or web address]  06/14/2023, 11:43 PM

## 2023-06-14 NOTE — ED Triage Notes (Addendum)
Pt BIB [facility] from home. EMS initially called for seizure like activity. Family reports it lasted about 5 seconds. Upon arrival EMS reports a 150-177 HR. Pt c/o of N/V and generalized weakness. Family denies any hx of seizures. Hx of dementia. EMS gave 10 mg of Cardizem and 500 NS. One episode of emesis w/ EMS. GCS 14  EMS: 122/46 164 CBG 2L [place] 93%  18 LAC 20 RAC

## 2023-06-14 NOTE — ED Provider Notes (Signed)
[facility] EMERGENCY DEPARTMENT AT [facility] Provider Note   CSN: [id number] Arrival date & time: 06/14/23  1329     History  Chief Complaint  Patient presents with   Afib RvR    Sophia Snyder is a 89 y.o. female.  HPI   89 year old female presents to the emergency department with concern for syncopal episode and arrhythmia.  Report from family member who is caring for her is that she has had diarrhea over the weekend.  He got her up to take her to the bathroom and while she was sitting down and straining he describes that she had generalized shaking and then became unresponsive for a couple seconds.  States seated on the toilet the whole time.  When EMS arrived they noted that she was tachycardic with heart rates in the 160s to 170s.  They report that it looks like A-fib with RVR.  She was given a small fluid bolus with a dose of Cardizem and on arrival she is back in normal sinus rhythm.  No history of arrhythmia.  Denies any fever or chills but significant decrease in appetite over the weekend.  Home Medications Prior to Admission medications   Medication Sig Start Date End Date Taking? Authorizing Provider  atorvastatin (LIPITOR) 80 MG tablet TAKE 1 TABLET BY MOUTH DAILY AT 6 PM. 01/14/22   Tonny Bollman, MD  clopidogrel (PLAVIX) 75 MG tablet TAKE 1 TABLET BY MOUTH DAILY 05/12/23   Georgeanna Lea, MD  famotidine (PEPCID) 20 MG tablet TAKE 1 TABLET BY MOUTH DAILY. 05/12/23   Georgeanna Lea, MD  feeding supplement (BOOST HIGH PROTEIN) LIQD Take 1 Container by mouth 2 (two) times daily between meals.    [provider]  furosemide (LASIX) 40 MG tablet TAKE 1 TABLET BY MOUTH DAILY Patient taking differently: Take 20 mg by mouth daily. 10/02/22   Tonny Bollman, MD  nebivolol (BYSTOLIC) 10 MG tablet TAKE 1/2 A TABLET BY MOUTH TWICE A DAY 05/12/23   Georgeanna Lea, MD  nitroGLYCERIN (NITROSTAT) 0.4 MG SL tablet Place 1 tablet (0.4 mg total) under the  tongue every 5 (five) minutes x 3 doses as needed for chest pain. 03/22/17   Tonny Bollman, MD      Allergies    Patient has no known allergies.    Review of Systems   Review of Systems  Constitutional:  Positive for appetite change, chills and fatigue. Negative for fever.  Respiratory:  Positive for shortness of breath.   Cardiovascular:  Negative for chest pain.  Gastrointestinal:  Positive for diarrhea. Negative for abdominal pain and vomiting.  Skin:  Negative for rash.  Neurological:  Positive for syncope. Negative for headaches.    Physical Exam Updated Vital Signs BP (!) 99/52   Pulse 77   Temp (!) 96.3 F (35.7 C) (Axillary)   Resp (!) 26   Ht 5\' 1"  (1.549 m)   Wt 47.2 kg   SpO2 95%   BMI 19.65 kg/m  Physical Exam Vitals and nursing note reviewed.  Constitutional:      General: She is not in acute distress.    Appearance: Normal appearance.  HENT:     Head: Normocephalic.     Mouth/Throat:     Mouth: Mucous membranes are moist.  Cardiovascular:     Rate and Rhythm: Normal rate.  Pulmonary:     Effort: Pulmonary effort is normal. No respiratory distress.  Abdominal:     General: Bowel sounds are normal.  Palpations: Abdomen is soft.     Tenderness: There is no abdominal tenderness. There is no guarding.  Skin:    General: Skin is warm.  Neurological:     Mental Status: She is alert and oriented to person, place, and time. Mental status is at baseline.  Psychiatric:        Mood and Affect: Mood normal.     ED Results / Procedures / Treatments   Labs (all labs ordered are listed, but only abnormal results are displayed) Labs Reviewed  CBC WITH DIFFERENTIAL/PLATELET - Abnormal; Notable for the following components:      Result Value   RBC 3.40 (*)    Hemoglobin 10.5 (*)    HCT 32.9 (*)    Neutro Abs 8.2 (*)    All other components within normal limits  COMPREHENSIVE METABOLIC PANEL - Abnormal; Notable for the following components:   CO2 19 (*)     Glucose, Bld 150 (*)    BUN 42 (*)    Creatinine, Ser 2.39 (*)    Calcium 8.6 (*)    Albumin 3.4 (*)    GFR, Estimated 18 (*)    Anion gap 16 (*)    All other components within normal limits  TROPONIN I (HIGH SENSITIVITY) - Abnormal; Notable for the following components:   Troponin I (High Sensitivity) 30 (*)    All other components within normal limits  MAGNESIUM  LIPASE, BLOOD  URINALYSIS, ROUTINE W REFLEX MICROSCOPIC  TROPONIN I (HIGH SENSITIVITY)    EKG EKG Interpretation Date/Time:  Monday June 14 2023 13:38:47 EDT Ventricular Rate:  84 PR Interval:  203 QRS Duration:  100 QT Interval:  375 QTC Calculation: 444 R Axis:   33  Text Interpretation: Sinus rhythm Borderline repolarization abnormality flipped t waves anteriorly seen on prior No significant change since last tracing Confirmed by Melene Plan [phone]) on 06/14/2023 3:13:47 PM  Radiology No results found.  Procedures Procedures    Medications Ordered in ED Medications  ondansetron (ZOFRAN) injection 4 mg (4 mg Intravenous Given 06/14/23 1433)  sodium chloride 0.9 % bolus 500 mL (0 mLs Intravenous Stopped 06/14/23 1554)    ED Course/ Medical Decision Making/ A&P                                 Medical Decision Making Amount and/or Complexity of Data Reviewed Labs: ordered. Radiology: ordered.  Risk Prescription drug management.   89 year old female presents emergency department with diarrhea, syncopal episode and reported tachycardia/arrhythmia prior to arrival.  On arrival she is normal sinus rhythm, stable blood pressure.  Complaining of nausea and holding an emesis bag.  Abdomen is soft without any significant tenderness.  Blood work so far shows a new AKI with an elevated troponin.  Patient will require admission given that however we are pending the completion of her lab evaluation as well as imaging.  Patient signed out pending complete workup and admission.        Final  Clinical Impression(s) / ED Diagnoses Final diagnoses:  None    Rx / DC Orders ED Discharge Orders     None         Rozelle Logan, DO 06/14/23 1609

## 2023-06-15 ENCOUNTER — Other Ambulatory Visit (HOSPITAL_COMMUNITY): Payer: Self-pay

## 2023-06-15 ENCOUNTER — Encounter (HOSPITAL_COMMUNITY): Payer: Self-pay | Admitting: Internal Medicine

## 2023-06-15 DIAGNOSIS — F05 Delirium due to known physiological condition: Secondary | ICD-10-CM

## 2023-06-15 DIAGNOSIS — I10 Essential (primary) hypertension: Secondary | ICD-10-CM | POA: Diagnosis not present

## 2023-06-15 DIAGNOSIS — I2510 Atherosclerotic heart disease of native coronary artery without angina pectoris: Secondary | ICD-10-CM | POA: Diagnosis not present

## 2023-06-15 DIAGNOSIS — K529 Noninfective gastroenteritis and colitis, unspecified: Secondary | ICD-10-CM | POA: Diagnosis not present

## 2023-06-15 DIAGNOSIS — N184 Chronic kidney disease, stage 4 (severe): Secondary | ICD-10-CM

## 2023-06-15 DIAGNOSIS — I5020 Unspecified systolic (congestive) heart failure: Secondary | ICD-10-CM | POA: Diagnosis not present

## 2023-06-15 HISTORY — DX: Delirium due to known physiological condition: F05

## 2023-06-15 LAB — CBC
HCT: 33.3 % — ABNORMAL LOW (ref 36.0–46.0)
Hemoglobin: 10.4 g/dL — ABNORMAL LOW (ref 12.0–15.0)
MCH: 30.1 pg (ref 26.0–34.0)
MCHC: 31.2 g/dL (ref 30.0–36.0)
MCV: 96.2 fL (ref 80.0–100.0)
Platelets: 190 10*3/uL (ref 150–400)
RBC: 3.46 MIL/uL — ABNORMAL LOW (ref 3.87–5.11)
RDW: 13.9 % (ref 11.5–15.5)
WBC: 11.7 10*3/uL — ABNORMAL HIGH (ref 4.0–10.5)
nRBC: 0 % (ref 0.0–0.2)

## 2023-06-15 LAB — BASIC METABOLIC PANEL
Anion gap: 14 (ref 5–15)
BUN: 39 mg/dL — ABNORMAL HIGH (ref 8–23)
CO2: 19 mmol/L — ABNORMAL LOW (ref 22–32)
Calcium: 9.2 mg/dL (ref 8.9–10.3)
Chloride: 105 mmol/L (ref 98–111)
Creatinine, Ser: 2.52 mg/dL — ABNORMAL HIGH (ref 0.44–1.00)
GFR, Estimated: 16 mL/min — ABNORMAL LOW (ref >60)
Glucose, Bld: 128 mg/dL — ABNORMAL HIGH (ref 70–99)
Potassium: 4.3 mmol/L (ref 3.5–5.1)
Sodium: 138 mmol/L (ref 135–145)

## 2023-06-15 MED ORDER — ASPIRIN 81 MG PO TBEC: 81.0000 mg | DELAYED_RELEASE_TABLET | Freq: Every day | ORAL | Status: DC

## 2023-06-15 MED ORDER — CEFDINIR 300 MG PO CAPS
300.0000 mg | ORAL_CAPSULE | Freq: Every day | ORAL | 0 refills | Status: AC
Start: 2023-06-15 — End: 2023-06-20
  Filled 2023-06-15: qty 5, 5d supply, fill #0

## 2023-06-15 MED ORDER — METRONIDAZOLE 500 MG PO TABS: 500.0000 mg | ORAL_TABLET | Freq: Two times a day (BID) | ORAL | Status: DC

## 2023-06-15 MED ORDER — METRONIDAZOLE 500 MG PO TABS
500.0000 mg | ORAL_TABLET | Freq: Two times a day (BID) | ORAL | 0 refills | Status: AC
  Filled 2023-06-15: qty 14, 7d supply, fill #0

## 2023-06-15 MED ORDER — FUROSEMIDE 40 MG PO TABS: 20.0000 mg | ORAL_TABLET | Freq: Every day | ORAL | Status: DC

## 2023-06-15 NOTE — ED Notes (Signed)
Alert, confused. Mittens removed. Breakfast tray provided. Patient able to feed herself. No difficulty swallowing noted. No complaints of pain or discomfort. Bed alarm in place. In view of nurses station for safety.

## 2023-06-15 NOTE — Progress Notes (Signed)
PROGRESS NOTE    Sophia Snyder  HQI:[id number] DOB: 24-Jun-1919 DOA: 06/14/2023 PCP: Sophia Ohara, MD  Subjective: Pt seen and examined. Met with pt's dtr Sophia Snyder and grand-son Sophia Snyder. Dtr states pt has a "touch of dementia and sundowning".  Pt getting progressive more combatative  Dtr requesting pt be discharged to home. Dtr states pt not eating and drinking because she is in the hospital Dtr states pt's sundowning behavior has happened before when pt was admitted to [facility].  Dtr states pt will be better taken care of at home rather than hospital.  Hand mittens had to be placed on patient due to agitation.   Hospital Course: HPI: Sophia Snyder is a 89 y.o. female with medical history significant for CAD s/p DES to LAD in 2017, chronic HFrEF (EF 30-35% 02/2019), CKD stage IV, HTN, HLD who presented to the ED for evaluation of diarrhea and syncopal event.  History is supplemented by daughter and grandson at bedside.   Patient lives with her grandson who helps care for her.  She developed diarrhea 2 days ago which has been slowly improving.  Stools have been more formed however she has not been having much bowel movements today due to poor oral intake.  When she went to use the bathroom she had a brief syncopal episode while on the toilet.  She did not fall or hit her head.   EMS were called to the home.  Per ED documentation patient was noted to have rapid heart rate in the 160s.  There was concern for A-fib with RVR.  Patient was given 10 mg diltiazem and 500 cc normal saline.  Tachyarrhythmia resolved by the time patient arrived to the ED.  There were no rhythm strips for review.   Patient has not had any recent changes in her medications or recent antibiotic use.  Denies any potential dietary cause of her GI illness.  Normally ambulates with use of a walker.   ED Course  Labs/Imaging on admission: I have personally reviewed following labs and imaging studies.   Initial  vitals showed BP 111/69, pulse 84, RR 16, temp 96.3 F, SpO2 96% on room air.   Labs show WBC 9.6, hemoglobin 10.5, platelets 159,000, sodium 136, potassium 4.1, bicarb 19, BUN 42, creatinine 2.39, serum glucose 150, LFTs within normal limits, troponin 30 > 38, magnesium 2.4, lipase 35.  Urinalysis pending collection.   Portable chest x-ray showed mildly enlarged cardiac silhouette, chronic interstitial changes.  Calcified aorta.   CT abdomen/pelvis showed segmental distal colonic wall thickening consistent with inflammatory, infectious, or ischemic colitis.  Trace bilateral pleural effusions noted.   Patient was given 500 cc normal saline, IV Unasyn, Zofran, Oxy IR, Tylenol.  The hospitalist service was consulted to admit for further evaluation and management.  Significant Events: Admitted 06/14/2023 for syncope and colitis   Significant Labs: Admit Scr 2.39, BUN 42  Significant Imaging Studies: CT abd/pelvis shows Segmental distal colonic wall thickening consistent with inflammatory, infectious, or ischemic colitis.  Antibiotic Therapy: Anti-infectives (From admission, onward)    Start     Dose/Rate Route Frequency Ordered Stop   06/15/23 2300  ampicillin-sulbactam (UNASYN) 1.5 g in sodium chloride 0.9 % 100 mL IVPB  Status:  Discontinued        1.5 g 200 mL/hr over 30 Minutes Intravenous Daily 06/14/23 2338 06/15/23 1433   06/15/23 1530  metroNIDAZOLE (FLAGYL) tablet 500 mg        500 mg Oral Every 12 hours  06/15/23 1433     06/15/23 0000  metroNIDAZOLE (FLAGYL) 500 MG tablet       Note to Pharmacy: Bring to bedside   500 mg Oral Every 12 hours 06/15/23 1449 06/22/23 2359   06/15/23 0000  cefdinir (OMNICEF) 300 MG capsule       Note to Pharmacy: Bring to bedside   300 mg Oral Daily 06/15/23 1449 06/20/23 2359   06/14/23 2300  Ampicillin-Sulbactam (UNASYN) 3 g in sodium chloride 0.9 % 100 mL IVPB        3 g 200 mL/hr over 30 Minutes Intravenous  Once 06/14/23 2255 06/14/23 2339        Procedures:   Consultants:     Assessment and Plan: * Colitis Likely viral in nature. Cannot exclude bacterial. Dtr requesting pt to be discharge this evening. I think this is reasonable given lack of sepsis picture. Will stop lasix for now until diarrhea has resolved. 5 days of po flagyl and omnicef. Do not want to give augmentin due to high incidence of diarrhea from augmentin.  Sundowning Pt with progressive worsening and agitation. Will discharge her before her behavior gets out of control.  Chronic kidney disease, stage 4 (severe) (HCC) Chronic. No ambulatory labs within [facility]. All her outpatient labs are with  hospital. F/u with PCP.  CAD (coronary artery disease) Stable.  HFrEF (heart failure with reduced ejection fraction) (HCC) Stable. Slightly dehydrated due to diarrhea. Will hold lasix until diarrhea stops.  Essential hypertension Stable. Hold lasix during diarrhea    DVT prophylaxis: heparin injection 5,000 Units Start: 06/15/23 0600    Code Status: Limited: Do not attempt resuscitation (DNR) -DNR-LIMITED -Do Not Intubate/DNI  Family Communication: discussed with pt's dtr Sophia Snyder at bedside Disposition Plan: return home Reason for continuing need for hospitalization: discharge to home per dtr request.  Objective: Vitals:   06/15/23 0703 06/15/23 0800 06/15/23 1128 06/15/23 1200  BP: (!) 154/91 135/71 105/60 (!) 104/59  Pulse: (!) 108 87 79 77  Resp:  17 16 17   Temp:  98.2 F (36.8 C)  98.6 F (37 C)  TempSrc:  Oral  Oral  SpO2: 97% 95% 98% 96%  Weight:      Height:        Intake/Output Summary (Last 24 hours) at 06/15/2023 1505 Last data filed at 06/15/2023 1237 Gross per 24 hour  Intake 664.4 ml  Output 1050 ml  Net -385.6 ml   Filed Weights   06/14/23 1405  Weight: 47.2 kg    Examination:  Physical Exam Vitals and nursing note reviewed.  Constitutional:      General: She is not in acute distress.    Appearance: She is  not toxic-appearing or diaphoretic.     Comments: Agitated. Fighting with nurses. Trying to scratch and punch at nurses  HENT:     Head: Normocephalic and atraumatic.     Nose: Nose normal.  Eyes:     General: No scleral icterus. Cardiovascular:     Rate and Rhythm: Normal rate.  Pulmonary:     Effort: Pulmonary effort is normal.  Abdominal:     General: Bowel sounds are normal. There is no distension.     Palpations: Abdomen is soft.  Musculoskeletal:     Right lower leg: No edema.     Left lower leg: No edema.  Skin:    General: Skin is warm and dry.  Neurological:     Mental Status: She is disoriented.  Psychiatric:  Behavior: Behavior is agitated.     Data Reviewed: I have personally reviewed following labs and imaging studies  CBC: Recent Labs  Lab 06/14/23 1353 06/15/23 0419  WBC 9.6 11.7*  NEUTROABS 8.2*  --   HGB 10.5* 10.4*  HCT 32.9* 33.3*  MCV 96.8 96.2  PLT 159 190   Basic Metabolic Panel: Recent Labs  Lab 06/14/23 1353 06/15/23 0419  NA 136 138  K 4.1 4.3  CL 101 105  CO2 19* 19*  GLUCOSE 150* 128*  BUN 42* 39*  CREATININE 2.39* 2.52*  CALCIUM 8.6* 9.2  MG 2.4  --    GFR: Estimated Creatinine Clearance: 8.6 mL/min (A) (by C-G formula based on SCr of 2.52 mg/dL (H)). Liver Function Tests: Recent Labs  Lab 06/14/23 1353  AST 28  ALT 10  ALKPHOS 96  BILITOT 0.5  PROT 6.6  ALBUMIN 3.4*   Recent Labs  Lab 06/14/23 1353  LIPASE 35    Radiology Studies: CT ABDOMEN PELVIS WO CONTRAST  Result Date: 06/14/2023 CLINICAL DATA:  Abdominal pain, atrial fibrillation EXAM: CT ABDOMEN AND PELVIS WITHOUT CONTRAST TECHNIQUE: Multidetector CT imaging of the abdomen and pelvis was performed following the standard protocol without IV contrast. Unenhanced CT was performed per clinician order. Lack of IV contrast limits sensitivity and specificity, especially for evaluation of abdominal/pelvic solid viscera. RADIATION DOSE REDUCTION: This exam  was performed according to the departmental dose-optimization program which includes automated exposure control, adjustment of the mA and/or kV according to patient size and/or use of iterative reconstruction technique. COMPARISON:  None Available. FINDINGS: Lower chest: Trace bilateral pleural effusions. Cardiomegaly without significant pericardial effusion. Hepatobiliary: 1.7 cm right lobe liver cyst. Otherwise unremarkable appearance of the liver and gallbladder. Pancreas: Unremarkable unenhanced appearance. Spleen: Unremarkable unenhanced appearance. Adrenals/Urinary Tract: No urinary tract calculi or obstructive uropathy within either kidney. The adrenals and bladder are unremarkable. Stomach/Bowel: No bowel obstruction or ileus. There is segmental colonic wall thickening extending from the splenic flexure through the sigmoid colon, compatible with inflammatory or infectious colitis. Normal retrocecal appendix. Vascular/Lymphatic: Aortic atherosclerosis. No enlarged abdominal or pelvic lymph nodes. Reproductive: Uterus and bilateral adnexa are unremarkable. Other: No free fluid or free intraperitoneal gas. No abdominal wall hernia. Musculoskeletal: No acute or destructive bony abnormalities. Reconstructed images demonstrate no additional findings. IMPRESSION: 1. Segmental distal colonic wall thickening consistent with inflammatory, infectious, or ischemic colitis. 2. Trace bilateral pleural effusions. 3.  Aortic Atherosclerosis (ICD10-I70.0). Electronically Signed   By: Sharlet Salina M.D.   On: 06/14/2023 22:52   DG Chest Port 1 View  Result Date: 06/14/2023 CLINICAL DATA:  Shortness of breath. AFib with rapid ventricular response. EXAM: PORTABLE CHEST 1 VIEW COMPARISON:  X-ray 07/18/2021 FINDINGS: Calcified aorta. Borderline size heart. No consolidation, pneumothorax or effusion no edema. Interstitial changes are seen which could be chronic. Overlapping cardiac leads. Osteopenia degenerative changes.  IMPRESSION: Mildly enlarged cardiac silhouette. Calcified aorta. Chronic interstitial changes. Electronically Signed   By: Karen Kays M.D.   On: 06/14/2023 17:13    Scheduled Meds:  [START ON 06/16/2023] aspirin EC  81 mg Oral Daily   atorvastatin  80 mg Oral Daily   famotidine  20 mg Oral Daily   feeding supplement  1 Container Oral BID BM   heparin  5,000 Units Subcutaneous Q8H   metroNIDAZOLE  500 mg Oral Q12H   nebivolol  5 mg Oral BID   sodium chloride flush  3 mL Intravenous Q12H   Continuous Infusions:   LOS: 0  days   Time spent: 35 minutes  Carollee Herter, DO  [facility]  06/15/2023, 3:05 PM

## 2023-06-15 NOTE — Subjective & Objective (Signed)
Pt seen and examined. Met with pt's dtr donna and grand-son chuck. Dtr states pt has a "touch of dementia and sundowning".  Pt getting progressive more combatative  Dtr requesting pt be discharged to home. Dtr states pt not eating and drinking because she is in the hospital Dtr states pt's sundowning behavior has happened before when pt was admitted to [facility].  Dtr states pt will be better taken care of at home rather than hospital.  Hand mittens had to be placed on patient due to agitation.

## 2023-06-15 NOTE — Discharge Summary (Signed)
[facility] Phys[facility] Discharge Summary   Patient name: Sophia Snyder  Admit date:     06/14/2023  Discharge date: 06/15/2023  Attending Physician: Charlsie Quest [[id number]]  Discharge Physician: Carollee Herter   PCP: Blane Ohara, MD  Admitted From: Home  Disposition:  Home  Recommendations for Outpatient Follow-up:  Follow up with PCP in 1-2 weeks Home Health:No Equipment/Devices: None    Discharge Condition:Stable CODE STATUS:DNR/DNI Diet recommendation: Heart Healthy Fluid Restriction: None  Hospital Summary: HPI: [name] is a 89 y.o. female with medical history significant for CAD s/p DES to LAD in 2017, chronic HFrEF (EF 30-35% 02/2019), CKD stage IV, HTN, HLD who presented to the ED for evaluation of diarrhea and syncopal event.  History is supplemented by daughter and grandson at bedside.   Patient lives with her grandson who helps care for her.  She developed diarrhea 2 days ago which has been slowly improving.  Stools have been more formed however she has not been having much bowel movements today due to poor oral intake.  When she went to use the bathroom she had a brief syncopal episode while on the toilet.  She did not fall or hit her head.   EMS were called to the home.  Per ED documentation patient was noted to have rapid heart rate in the 160s.  There was concern for A-fib with RVR.  Patient was given 10 mg diltiazem and 500 cc normal saline.  Tachyarrhythmia resolved by the time patient arrived to the ED.  There were no rhythm strips for review.   Patient has not had any recent changes in her medications or recent antibiotic use.  Denies any potential dietary cause of her GI illness.  Normally ambulates with use of a walker.   ED Course  Labs/Imaging on admission: I have personally reviewed following labs and imaging studies.   Initial vitals showed BP 111/69, pulse 84, RR 16, temp 96.3 F, SpO2 96% on room air.   Labs show WBC 9.6, hemoglobin 10.5,  platelets 159,000, sodium 136, potassium 4.1, bicarb 19, BUN 42, creatinine 2.39, serum glucose 150, LFTs within normal limits, troponin 30 > 38, magnesium 2.4, lipase 35.  Urinalysis pending collection.   Portable chest x-ray showed mildly enlarged cardiac silhouette, chronic interstitial changes.  Calcified aorta.   CT abdomen/pelvis showed segmental distal colonic wall thickening consistent with inflammatory, infectious, or ischemic colitis.  Trace bilateral pleural effusions noted.   Patient was given 500 cc normal saline, IV Unasyn, Zofran, Oxy IR, Tylenol.  The hospitalist service was consulted to admit for further evaluation and management.  Significant Events: Admitted 06/14/2023 for syncope and colitis   Significant Labs: Admit Scr 2.39, BUN 42  Significant Imaging Studies: CT abd/pelvis shows Segmental distal colonic wall thickening consistent with inflammatory, infectious, or ischemic colitis.  Antibiotic Therapy: Anti-infectives (From admission, onward)    Start     Dose/Rate Route Frequency Ordered Stop   06/15/23 2300  ampicillin-sulbactam (UNASYN) 1.5 g in sodium chloride 0.9 % 100 mL IVPB  Status:  Discontinued        1.5 g 200 mL/hr over 30 Minutes Intravenous Daily 06/14/23 2338 06/15/23 1433   06/15/23 1530  metroNIDAZOLE (FLAGYL) tablet 500 mg        500 mg Oral Every 12 hours 06/15/23 1433     06/15/23 0000  metroNIDAZOLE (FLAGYL) 500 MG tablet       Note to Pharmacy: Bring to bedside   500 mg Oral  Every 12 hours 06/15/23 1449 06/22/23 2359   06/15/23 0000  cefdinir (OMNICEF) 300 MG capsule       Note to Pharmacy: Bring to bedside   300 mg Oral Daily 06/15/23 1449 06/20/23 2359   06/14/23 2300  Ampicillin-Sulbactam (UNASYN) 3 g in sodium chloride 0.9 % 100 mL IVPB        3 g 200 mL/hr over 30 Minutes Intravenous  Once 06/14/23 2255 06/14/23 2339       Procedures:   Consultants:    Hospital Course by Problem: * Colitis Likely viral in nature.  Cannot exclude bacterial. Dtr requesting pt to be discharge this evening. I think this is reasonable given lack of sepsis picture. Will stop lasix for now until diarrhea has resolved. 5 days of po flagyl and omnicef. Do not want to give augmentin due to high incidence of diarrhea from augmentin.  Sundowning Pt with progressive worsening and agitation. Will discharge her before her behavior gets out of control.  Chronic kidney disease, stage 4 (severe) (HCC) Chronic. No ambulatory labs within [facility]. All her outpatient labs are with [facility]. F/u with PCP.  CAD (coronary artery disease) Stable.  HFrEF (heart failure with reduced ejection fraction) (HCC) Stable. Slightly dehydrated due to diarrhea. Will hold lasix until diarrhea stops.  Essential hypertension Stable. Hold lasix during diarrhea    Discharge Diagnoses:  Principal Problem:   Colitis Active Problems:   Sundowning   Essential hypertension   HFrEF (heart failure with reduced ejection fraction) (HCC)   CAD (coronary artery disease)   Chronic kidney disease, stage 4 (severe) (HCC)   Discharge Instructions  Discharge Instructions     Call MD for:  difficulty breathing, headache or visual disturbances   Complete by: As directed    Call MD for:  extreme fatigue   Complete by: As directed    Call MD for:  hives   Complete by: As directed    Call MD for:  persistant dizziness or light-headedness   Complete by: As directed    Call MD for:  persistant nausea and vomiting   Complete by: As directed    Call MD for:  redness, tenderness, or signs of infection (pain, swelling, redness, odor or green/yellow discharge around incision site)   Complete by: As directed    Call MD for:  temperature >100.4   Complete by: As directed    Diet - low sodium heart healthy   Complete by: As directed    Discharge instructions   Complete by: As directed    1. Follow up with primary care physician in 1 week following hospital  discharge 2. Do not take any Lasix(furosemide) until diarrhea resolves   Increase activity slowly   Complete by: As directed       Allergies as of 06/15/2023   No Known Allergies      Medication List     TAKE these medications    atorvastatin 80 MG tablet Commonly known as: LIPITOR TAKE 1 TABLET BY MOUTH DAILY AT 6 PM.   cefdinir 300 MG capsule Commonly known as: OMNICEF Take 1 capsule (300 mg total) by mouth daily for 5 days.   clopidogrel 75 MG tablet Commonly known as: PLAVIX TAKE 1 TABLET BY MOUTH DAILY   famotidine 20 MG tablet Commonly known as: PEPCID TAKE 1 TABLET BY MOUTH DAILY.   feeding supplement Liqd Take 1 Container by mouth 2 (two) times daily between meals.   furosemide 40 MG tablet Commonly known as:  LASIX Take 0.5 tablets (20 mg total) by mouth daily. Start taking on: June 18, 2023 What changed:  how much to take These instructions start on June 18, 2023. If you are unsure what to do until then, ask your doctor or other care provider.   loperamide 1 MG/5ML solution Commonly known as: IMODIUM Take 3-6 mg by mouth 2 (two) times daily as needed for diarrhea or loose stools.   metroNIDAZOLE 500 MG tablet Commonly known as: FLAGYL Take 1 tablet (500 mg total) by mouth every 12 (twelve) hours for 7 days.   nebivolol 10 MG tablet Commonly known as: BYSTOLIC TAKE 1/2 A TABLET BY MOUTH TWICE A DAY   nitroGLYCERIN 0.4 MG SL tablet Commonly known as: NITROSTAT Place 1 tablet (0.4 mg total) under the tongue every 5 (five) minutes x 3 doses as needed for chest pain.        No Known Allergies  Discharge Exam: Vitals:   06/15/23 1128 06/15/23 1200  BP: 105/60 (!) 104/59  Pulse: 79 77  Resp: 16 17  Temp:  98.6 F (37 C)  SpO2: 98% 96%    Physical Exam Vitals and nursing note reviewed.  Constitutional:      General: She is not in acute distress.    Appearance: She is not toxic-appearing or diaphoretic.     Comments: Agitated.  Fighting with nurses. Trying to scratch and punch at nurses  HENT:     Head: Normocephalic and atraumatic.     Nose: Nose normal.  Eyes:     General: No scleral icterus. Cardiovascular:     Rate and Rhythm: Normal rate.  Pulmonary:     Effort: Pulmonary effort is normal.  Abdominal:     General: Bowel sounds are normal. There is no distension.     Palpations: Abdomen is soft.  Musculoskeletal:     Right lower leg: No edema.     Left lower leg: No edema.  Skin:    General: Skin is warm and dry.  Neurological:     Mental Status: She is disoriented.  Psychiatric:        Behavior: Behavior is agitated.     The results of significant diagnostics from this hospitalization (including imaging, microbiology, ancillary and laboratory) are listed below for reference.    Microbiology: No results found for this or any previous visit (from the past 240 hour(s)).   Labs: Basic Metabolic Panel: Recent Labs  Lab 06/14/23 1353 06/15/23 0419  NA 136 138  K 4.1 4.3  CL 101 105  CO2 19* 19*  GLUCOSE 150* 128*  BUN 42* 39*  CREATININE 2.39* 2.52*  CALCIUM 8.6* 9.2  MG 2.4  --    Liver Function Tests: Recent Labs  Lab 06/14/23 1353  AST 28  ALT 10  ALKPHOS 96  BILITOT 0.5  PROT 6.6  ALBUMIN 3.4*   Recent Labs  Lab 06/14/23 1353  LIPASE 35    CBC: Recent Labs  Lab 06/14/23 1353 06/15/23 0419  WBC 9.6 11.7*  NEUTROABS 8.2*  --   HGB 10.5* 10.4*  HCT 32.9* 33.3*  MCV 96.8 96.2  PLT 159 190   Urinalysis    Component Value Date/Time   COLORURINE YELLOW 06/14/2023 2335   APPEARANCEUR CLEAR 06/14/2023 2335   LABSPEC 1.008 06/14/2023 2335   PHURINE 6.0 06/14/2023 2335   GLUCOSEU NEGATIVE 06/14/2023 2335   HGBUR NEGATIVE 06/14/2023 2335   BILIRUBINUR NEGATIVE 06/14/2023 2335   KETONESUR NEGATIVE 06/14/2023 2335   PROTEINUR  NEGATIVE 06/14/2023 2335   NITRITE NEGATIVE 06/14/2023 2335   LEUKOCYTESUR NEGATIVE 06/14/2023 2335   Sepsis Labs Recent Labs  Lab  06/14/23 1353 06/15/23 0419  WBC 9.6 11.7*   Microbiology No results found for this or any previous visit (from the past 240 hour(s)).  Procedures/Studies: CT ABDOMEN PELVIS WO CONTRAST  Result Date: 06/14/2023 CLINICAL DATA:  Abdominal pain, atrial fibrillation EXAM: CT ABDOMEN AND PELVIS WITHOUT CONTRAST TECHNIQUE: Multidetector CT imaging of the abdomen and pelvis was performed following the standard protocol without IV contrast. Unenhanced CT was performed per clinician order. Lack of IV contrast limits sensitivity and specificity, especially for evaluation of abdominal/pelvic solid viscera. RADIATION DOSE REDUCTION: This exam was performed according to the departmental dose-optimization program which includes automated exposure control, adjustment of the mA and/or kV according to patient size and/or use of iterative reconstruction technique. COMPARISON:  None Available. FINDINGS: Lower chest: Trace bilateral pleural effusions. Cardiomegaly without significant pericardial effusion. Hepatobiliary: 1.7 cm right lobe liver cyst. Otherwise unremarkable appearance of the liver and gallbladder. Pancreas: Unremarkable unenhanced appearance. Spleen: Unremarkable unenhanced appearance. Adrenals/Urinary Tract: No urinary tract calculi or obstructive uropathy within either kidney. The adrenals and bladder are unremarkable. Stomach/Bowel: No bowel obstruction or ileus. There is segmental colonic wall thickening extending from the splenic flexure through the sigmoid colon, compatible with inflammatory or infectious colitis. Normal retrocecal appendix. Vascular/Lymphatic: Aortic atherosclerosis. No enlarged abdominal or pelvic lymph nodes. Reproductive: Uterus and bilateral adnexa are unremarkable. Other: No free fluid or free intraperitoneal gas. No abdominal wall hernia. Musculoskeletal: No acute or destructive bony abnormalities. Reconstructed images demonstrate no additional findings. IMPRESSION: 1. Segmental  distal colonic wall thickening consistent with inflammatory, infectious, or ischemic colitis. 2. Trace bilateral pleural effusions. 3.  Aortic Atherosclerosis (ICD10-I70.0). Electronically Signed   By: Sharlet Salina M.D.   On: 06/14/2023 22:52   DG Chest Port 1 View  Result Date: 06/14/2023 CLINICAL DATA:  Shortness of breath. AFib with rapid ventricular response. EXAM: PORTABLE CHEST 1 VIEW COMPARISON:  X-ray 07/18/2021 FINDINGS: Calcified aorta. Borderline size heart. No consolidation, pneumothorax or effusion no edema. Interstitial changes are seen which could be chronic. Overlapping cardiac leads. Osteopenia degenerative changes. IMPRESSION: Mildly enlarged cardiac silhouette. Calcified aorta. Chronic interstitial changes. Electronically Signed   By: Karen Kays M.D.   On: 06/14/2023 17:13    Time coordinating discharge: 35 mins  SIGNED:  Carollee Herter, DO [facility] 06/15/23, 3:07 PM

## 2023-06-15 NOTE — TOC Initial Note (Signed)
Transition of Care [facility]) - Initial/Assessment Note    Patient Details  Name: Sophia Snyder MRN: [id number] Date of Birth: [birth year]  Transition of Care [facility]) CM/SW Contact:    Leone Haven, RN Phone Number: 06/15/2023, 3:07 PM  Clinical Narrative:                 From home with grandson, has PCP and insurance on file, states has no HH services in place at this time , she has rollator, 3 n 1, shower chair x 2 and air purifier.  Daughter will transport her  home at dc  by car and family is support system,   Pta self ambulatory with walker. Per Daughter at the bedside, they do not need any HH services at this time.  She has an apt coming up in December with her PCP.  , Dr. Sedalia Muta. She is still ambulatory and will be 89 in a November.   Expected Discharge Plan: Home/Self Care Barriers to Discharge: No Barriers Identified   Patient Goals and CMS Choice Patient states their goals for this hospitalization and ongoing recovery are:: return home   Choice offered to / list presented to : NA      Expected Discharge Plan and Services In-house Referral: NA Discharge Planning Services: CM Consult Post Acute Care Choice: NA Living arrangements for the past 2 months: Single Family Home Expected Discharge Date: 06/15/23               DME Arranged: N/A DME Agency: NA       HH Arranged: NA          Prior Living Arrangements/Services Living arrangements for the past 2 months: Single Family Home Lives with:: Adult Children (grandson) Patient language and need for interpreter reviewed:: Yes Do you feel safe going back to the place where you live?: Yes      Need for Family Participation in Patient Care: Yes (Comment) Care giver support system in place?: Yes (comment) Current home services: DME (rollator, 3 n 1, shower chair x 2, air purifier) Criminal Activity/Legal Involvement Pertinent to Current Situation/Hospitalization: No - Comment as needed  Activities of Daily Living    ADL Screening (condition at time of admission) Independently performs ADLs?: Yes (appropriate for developmental age) Is the patient deaf or have difficulty hearing?: No Does the patient have difficulty seeing, even when wearing glasses/contacts?: Yes Does the patient have difficulty concentrating, remembering, or making decisions?: Yes  Permission Sought/Granted Permission sought to share information with : Case Manager Permission granted to share information with : Yes, Verbal Permission Granted              Emotional Assessment Appearance:: Appears stated age     Orientation: : Oriented to Self Alcohol / Substance Use: Not Applicable Psych Involvement: No (comment)  Admission diagnosis:  Colitis [K52.9] Patient Active Problem List   Diagnosis Date Noted   Sundowning 06/15/2023   Colitis 06/14/2023   Moderate protein-calorie malnutrition (HCC) 03/06/2023   Memory loss 03/06/2023   Weakness of both lower extremities 03/06/2023   Arthritis 05/06/2021   Adult BMI <19 kg/sq m 05/06/2021   Malnutrition of mild degree (HCC) 05/06/2021   Chronic kidney disease, stage 4 (severe) (HCC) 05/06/2021   Kyphoscoliosis 05/06/2021   CAD (coronary artery disease) 01/28/2016   HFrEF (heart failure with reduced ejection fraction) (HCC) 01/01/2016   Hyperlipidemia LDL goal <70 12/30/2015   Essential hypertension 12/30/2015   History of anterior wall myocardial infarction 12/28/2015  S/P angioplasty with stent 12/28/2015   PCP:  Blane Ohara, MD Pharmacy:   [facility] - [place], [place] - [address] [place] [address] Phone: [phone] Fax: [phone]  [facility] - [place], [place] - [address] [place] [address] Phone: [phone] Fax: [phone]  [facility] [address] [place] [address] Phone: [phone] Fax: [phone]     Social  Determinants of Health (SDOH) Social History: SDOH Screenings   Food Insecurity: No Food Insecurity (06/15/2023)  Housing: Patient Declined (06/15/2023)  Transportation Needs: No Transportation Needs (06/15/2023)  Utilities: Not At Risk (06/15/2023)  Alcohol Screen: Low Risk  (04/13/2023)  Depression (PHQ2-9): Low Risk  (04/13/2023)  Financial Resource Strain: Low Risk  (04/13/2023)  Physical Activity: Inactive (04/13/2023)  Social Connections: Socially Isolated (04/13/2023)  Stress: No Stress Concern Present (04/13/2023)  Tobacco Use: Low Risk  (06/15/2023)  Health Literacy: Adequate Health Literacy (04/13/2023)   SDOH Interventions:     Readmission Risk Interventions     No data to display

## 2023-06-15 NOTE — TOC Transition Note (Signed)
Transition of Care [facility]) - CM/SW Discharge Note   Patient Details  Name: Sophia Snyder MRN: [id number] Date of Birth: [birth year]  Transition of Care [facility]) CM/SW Contact:  Leone Haven, RN Phone Number: 06/15/2023, 3:13 PM   Clinical Narrative:    For dc , daughter is at the bedside to transport her home.    Final next level of care: Home/Self Care Barriers to Discharge: No Barriers Identified   Patient Goals and CMS Choice   Choice offered to / list presented to : NA  Discharge Placement                         Discharge Plan and Services Additional resources added to the After Visit Summary for   In-house Referral: NA Discharge Planning Services: CM Consult Post Acute Care Choice: NA          DME Arranged: N/A DME Agency: NA       HH Arranged: NA          Social Determinants of Health (SDOH) Interventions SDOH Screenings   Food Insecurity: No Food Insecurity (06/15/2023)  Housing: Patient Declined (06/15/2023)  Transportation Needs: No Transportation Needs (06/15/2023)  Utilities: Not At Risk (06/15/2023)  Alcohol Screen: Low Risk  (04/13/2023)  Depression (PHQ2-9): Low Risk  (04/13/2023)  Financial Resource Strain: Low Risk  (04/13/2023)  Physical Activity: Inactive (04/13/2023)  Social Connections: Socially Isolated (04/13/2023)  Stress: No Stress Concern Present (04/13/2023)  Tobacco Use: Low Risk  (06/15/2023)  Health Literacy: Adequate Health Literacy (04/13/2023)     Readmission Risk Interventions     No data to display

## 2023-06-15 NOTE — Assessment & Plan Note (Signed)
Pt with progressive worsening and agitation. Will discharge her before her behavior gets out of control.

## 2023-06-15 NOTE — Assessment & Plan Note (Signed)
Likely viral in nature. Cannot exclude bacterial. Dtr requesting pt to be discharge this evening. I think this is reasonable given lack of sepsis picture. Will stop lasix for now until diarrhea has resolved. 5 days of po flagyl and omnicef. Do not want to give augmentin due to high incidence of diarrhea from augmentin.

## 2023-06-15 NOTE — ED Notes (Signed)
Help get patient straighten up in the bed placed another brief changed sheets

## 2023-06-15 NOTE — Assessment & Plan Note (Signed)
Stable

## 2023-06-15 NOTE — ED Notes (Signed)
Confused. Trying to get out of bed every few minutes. Unable to re-direct

## 2023-06-15 NOTE — ED Notes (Signed)
Attempted to bladder scan pt. As she has not urinated and increased confusion/aggression. When attempting to bladder scan pt. Began attempting to kick and hit RN and NT. Efforts were discontinued at this time. Will continue to monitor. External urinary device in place.

## 2023-06-15 NOTE — ED Notes (Signed)
Pt. Ripping BP cuff off while attempting to get hourly BP.

## 2023-06-15 NOTE — Assessment & Plan Note (Signed)
Stable. Slightly dehydrated due to diarrhea. Will hold lasix until diarrhea stops.

## 2023-06-15 NOTE — Assessment & Plan Note (Signed)
Stable. Hold lasix during diarrhea

## 2023-06-15 NOTE — ED Notes (Signed)
No urine noted in brief or in purewick/ suction canister this shift. Patient states she needs to void. Patient assisted with 2 person assist to [facility]. Voided without difficulty.

## 2023-06-15 NOTE — Assessment & Plan Note (Signed)
Chronic. No ambulatory labs within [facility]. All her outpatient labs are with [facility]. F/u with PCP.

## 2023-06-15 NOTE — ED Notes (Signed)
ED TO INPATIENT HANDOFF REPORT  ED Nurse Name and Phone #: Corinna Burkman 32  S Name/Age/Gender Sophia Snyder 89 y.o. female Room/Bed: 002C/002C  Code Status   Code Status: Limited: Do not attempt resuscitation (DNR) -DNR-LIMITED -Do Not Intubate/DNI   Home/SNF/Other Home Patient oriented to: self Is this baseline? Yes   Triage Complete: Triage complete  Chief Complaint Colitis [K52.9]  Triage Note Pt BIB [facility] from home. EMS initially called for seizure like activity. Family reports it lasted about 5 seconds. Upon arrival EMS reports a 150-177 HR. Pt c/o of N/V and generalized weakness. Family denies any hx of seizures. Hx of dementia. EMS gave 10 mg of Cardizem and 500 NS. One episode of emesis w/ EMS. GCS 14  EMS: 122/46 164 CBG 2L [place] 93%  18 LAC 20 RAC   Allergies No Known Allergies  Level of Care/Admitting Diagnosis ED Disposition     ED Disposition  Admit   Condition  --   Comment  Hospital Area: [facility] [[id number]]  Level of Care: Telemetry Cardiac [103]  May place patient in observation at [facility] or [facility] if equivalent level of care is available:: No  Covid Evaluation: Asymptomatic - no recent exposure (last 10 days) testing not required  Diagnosis: Colitis [409811]  Admitting Physician: Charlsie Quest [[id number]]  Attending Physician: Charlsie Quest [[id number]]          B Medical/Surgery History Past Medical History:  Diagnosis Date   Arthritis    CAD (coronary artery disease), native coronary artery    chronic rt coroanry artery occlusion   Essential hypertension 12/30/2015   GERD (gastroesophageal reflux disease)    Hyperlipidemia LDL goal <70 12/30/2015   S/P angioplasty with stent 12/28/2015   DES to LAD   ST elevation myocardial infarction (STEMI) of anterior wall (HCC) 12/28/2015   Past Surgical History:  Procedure Laterality Date   BALLOON ANGIOPLASTY, ARTERY     85 or 86 years old   CARDIAC  CATHETERIZATION N/A 12/28/2015   Procedure: Left Heart Cath and Coronary Angiography;  Surgeon: Tonny Bollman, MD;  Location: [facility] INVASIVE CV LAB;  Service: Cardiovascular;  Laterality: N/A;   CARDIAC CATHETERIZATION N/A 12/28/2015   Procedure: Coronary Stent Intervention;  Surgeon: Tonny Bollman, MD;  Location: [facility] INVASIVE CV LAB;  Service: Cardiovascular;  Laterality: N/A;     A IV Location/Drains/Wounds Patient Lines/Drains/Airways Status     Active Line/Drains/Airways     Name Placement date Placement time Site Days   Peripheral IV 06/14/23 18 G Left Forearm 06/14/23  --  Forearm  1   Peripheral IV 06/14/23 20 G Anterior;Right Forearm 06/14/23  --  Forearm  1   External Urinary Catheter 06/14/23  1537  --  1            Intake/Output Last 24 hours  Intake/Output Summary (Last 24 hours) at 06/15/2023 1240 Last data filed at 06/15/2023 1237 Gross per 24 hour  Intake 664.4 ml  Output 1050 ml  Net -385.6 ml    Labs/Imaging Results for orders placed or performed during the hospital encounter of 06/14/23 (from the past 48 hour(s))  CBC with Differential     Status: Abnormal   Collection Time: 06/14/23  1:53 PM  Result Value Ref Range   WBC 9.6 4.0 - 10.5 K/uL   RBC 3.40 (L) 3.87 - 5.11 MIL/uL   Hemoglobin 10.5 (L) 12.0 - 15.0 g/dL   HCT 65.7 (L) 84.6 - 96.2 %  MCV 96.8 80.0 - 100.0 fL   MCH 30.9 26.0 - 34.0 pg   MCHC 31.9 30.0 - 36.0 g/dL   RDW 16.1 09.6 - 04.5 %   Platelets 159 150 - 400 K/uL   nRBC 0.0 0.0 - 0.2 %   Neutrophils Relative % 85 %   Neutro Abs 8.2 (H) 1.7 - 7.7 K/uL   Lymphocytes Relative 8 %   Lymphs Abs 0.8 0.7 - 4.0 K/uL   Monocytes Relative 5 %   Monocytes Absolute 0.5 0.1 - 1.0 K/uL   Eosinophils Relative 1 %   Eosinophils Absolute 0.1 0.0 - 0.5 K/uL   Basophils Relative 0 %   Basophils Absolute 0.0 0.0 - 0.1 K/uL   Immature Granulocytes 1 %   Abs Immature Granulocytes 0.05 0.00 - 0.07 K/uL    Comment: Performed at [facility]  Lab, [address], [place], [place] [address]  Comprehensive metabolic panel     Status: Abnormal   Collection Time: 06/14/23  1:53 PM  Result Value Ref Range   Sodium 136 135 - 145 mmol/L   Potassium 4.1 3.5 - 5.1 mmol/L   Chloride 101 98 - 111 mmol/L   CO2 19 (L) 22 - 32 mmol/L   Glucose, Bld 150 (H) 70 - 99 mg/dL    Comment: Glucose reference range applies only to samples taken after fasting for at least 8 hours.   BUN 42 (H) 8 - 23 mg/dL   Creatinine, Ser 1.91 (H) 0.44 - 1.00 mg/dL   Calcium 8.6 (L) 8.9 - 10.3 mg/dL   Total Protein 6.6 6.5 - 8.1 g/dL   Albumin 3.4 (L) 3.5 - 5.0 g/dL   AST 28 15 - 41 U/L   ALT 10 0 - 44 U/L   Alkaline Phosphatase 96 38 - 126 U/L   Total Bilirubin 0.5 0.3 - 1.2 mg/dL   GFR, Estimated 18 (L) >60 mL/min    Comment: (NOTE) Calculated using the CKD-EPI Creatinine Equation (2021)    Anion gap 16 (H) 5 - 15    Comment: Performed at [facility] Lab, [address], [place], [place] [address]  Troponin I (High Sensitivity)     Status: Abnormal   Collection Time: 06/14/23  1:53 PM  Result Value Ref Range   Troponin I (High Sensitivity) 30 (H) <18 ng/L    Comment: (NOTE) Elevated high sensitivity troponin I (hsTnI) values and significant  changes across serial measurements may suggest ACS but many other  chronic and acute conditions are known to elevate hsTnI results.  Refer to the "Links" section for chest pain algorithms and additional  guidance. Performed at [facility] Lab, [address], [place], [place] [address]   Magnesium     Status: None   Collection Time: 06/14/23  1:53 PM  Result Value Ref Range   Magnesium 2.4 1.7 - 2.4 mg/dL    Comment: Performed at [facility] Lab, [address], [place], [place] [address]  Lipase, blood     Status: None   Collection Time: 06/14/23  1:53 PM  Result Value Ref Range   Lipase 35 11 - 51 U/L    Comment: Performed at [facility] Lab, [address] [place], [place] [address]  Troponin I  (High Sensitivity)     Status: Abnormal   Collection Time: 06/14/23  4:28 PM  Result Value Ref Range   Troponin I (High Sensitivity) 38 (H) <18 ng/L    Comment: (NOTE)  Elevated high sensitivity troponin I (hsTnI) values and significant  changes across serial measurements may suggest ACS but many other  chronic and acute conditions are known to elevate hsTnI results.  Refer to the "Links" section for chest pain algorithms and additional  guidance. Performed at [facility] Lab, [address] [place], [place] [address]   Urinalysis, Routine w reflex microscopic -Urine, Clean Catch     Status: None   Collection Time: 06/14/23 11:35 PM  Result Value Ref Range   Color, Urine YELLOW YELLOW   APPearance CLEAR CLEAR   Specific Gravity, Urine 1.008 1.005 - 1.030   pH 6.0 5.0 - 8.0   Glucose, UA NEGATIVE NEGATIVE mg/dL   Hgb urine dipstick NEGATIVE NEGATIVE   Bilirubin Urine NEGATIVE NEGATIVE   Ketones, ur NEGATIVE NEGATIVE mg/dL   Protein, ur NEGATIVE NEGATIVE mg/dL   Nitrite NEGATIVE NEGATIVE   Leukocytes,Ua NEGATIVE NEGATIVE    Comment: Performed at [facility] Lab, [address], [place], [place] [address]  CBC     Status: Abnormal   Collection Time: 06/15/23  4:19 AM  Result Value Ref Range   WBC 11.7 (H) 4.0 - 10.5 K/uL   RBC 3.46 (L) 3.87 - 5.11 MIL/uL   Hemoglobin 10.4 (L) 12.0 - 15.0 g/dL   HCT 29.5 (L) 28.4 - 13.2 %   MCV 96.2 80.0 - 100.0 fL   MCH 30.1 26.0 - 34.0 pg   MCHC 31.2 30.0 - 36.0 g/dL   RDW 44.0 10.2 - 72.5 %   Platelets 190 150 - 400 K/uL   nRBC 0.0 0.0 - 0.2 %    Comment: Performed at [facility] Lab, [address], [place], [place] [address]  Basic metabolic panel     Status: Abnormal   Collection Time: 06/15/23  4:19 AM  Result Value Ref Range   Sodium 138 135 - 145 mmol/L   Potassium 4.3 3.5 - 5.1 mmol/L   Chloride 105 98 - 111 mmol/L   CO2 19 (L) 22 - 32 mmol/L   Glucose, Bld 128 (H) 70 - 99 mg/dL    Comment: Glucose reference range  applies only to samples taken after fasting for at least 8 hours.   BUN 39 (H) 8 - 23 mg/dL   Creatinine, Ser 0.34 (H) 0.44 - 1.00 mg/dL   Calcium 9.2 8.9 - 74.2 mg/dL   GFR, Estimated 16 (L) >60 mL/min    Comment: (NOTE) Calculated using the CKD-EPI Creatinine Equation (2021)    Anion gap 14 5 - 15    Comment: Performed at [facility] Lab, [address], [place], [place] [address]   CT ABDOMEN PELVIS WO CONTRAST  Result Date: 06/14/2023 CLINICAL DATA:  Abdominal pain, atrial fibrillation EXAM: CT ABDOMEN AND PELVIS WITHOUT CONTRAST TECHNIQUE: Multidetector CT imaging of the abdomen and pelvis was performed following the standard protocol without IV contrast. Unenhanced CT was performed per clinician order. Lack of IV contrast limits sensitivity and specificity, especially for evaluation of abdominal/pelvic solid viscera. RADIATION DOSE REDUCTION: This exam was performed according to the departmental dose-optimization program which includes automated exposure control, adjustment of the mA and/or kV according to patient size and/or use of iterative reconstruction technique. COMPARISON:  None Available. FINDINGS: Lower chest: Trace bilateral pleural effusions. Cardiomegaly without significant pericardial effusion. Hepatobiliary: 1.7 cm right lobe liver cyst. Otherwise unremarkable appearance of the liver and gallbladder. Pancreas: Unremarkable unenhanced appearance. Spleen: Unremarkable unenhanced appearance. Adrenals/Urinary Tract: No urinary tract calculi or  obstructive uropathy within either kidney. The adrenals and bladder are unremarkable. Stomach/Bowel: No bowel obstruction or ileus. There is segmental colonic wall thickening extending from the splenic flexure through the sigmoid colon, compatible with inflammatory or infectious colitis. Normal retrocecal appendix. Vascular/Lymphatic: Aortic atherosclerosis. No enlarged abdominal or pelvic lymph nodes. Reproductive: Uterus and bilateral adnexa  are unremarkable. Other: No free fluid or free intraperitoneal gas. No abdominal wall hernia. Musculoskeletal: No acute or destructive bony abnormalities. Reconstructed images demonstrate no additional findings. IMPRESSION: 1. Segmental distal colonic wall thickening consistent with inflammatory, infectious, or ischemic colitis. 2. Trace bilateral pleural effusions. 3.  Aortic Atherosclerosis (ICD10-I70.0). Electronically Signed   By: Sharlet Salina M.D.   On: 06/14/2023 22:52   DG Chest Port 1 View  Result Date: 06/14/2023 CLINICAL DATA:  Shortness of breath. AFib with rapid ventricular response. EXAM: PORTABLE CHEST 1 VIEW COMPARISON:  X-ray 07/18/2021 FINDINGS: Calcified aorta. Borderline size heart. No consolidation, pneumothorax or effusion no edema. Interstitial changes are seen which could be chronic. Overlapping cardiac leads. Osteopenia degenerative changes. IMPRESSION: Mildly enlarged cardiac silhouette. Calcified aorta. Chronic interstitial changes. Electronically Signed   By: Karen Kays M.D.   On: 06/14/2023 17:13    Pending Labs Unresulted Labs (From admission, onward)    None       Vitals/Pain Today's Vitals   06/15/23 0800 06/15/23 1128 06/15/23 1200 06/15/23 1236  BP: 135/71 105/60 (!) 104/59   Pulse: 87 79 77   Resp: 17 16 17    Temp: 98.2 F (36.8 C)  98.6 F (37 C)   TempSrc: Oral  Oral   SpO2: 95% 98% 96%   Weight:      Height:      PainSc: 0-No pain  0-No pain 0-No pain    Isolation Precautions No active isolations  Medications Medications  heparin injection 5,000 Units (5,000 Units Subcutaneous Given 06/15/23 0832)  sodium chloride flush (NS) 0.9 % injection 3 mL (3 mLs Intravenous Given 06/15/23 0834)  acetaminophen (TYLENOL) tablet 650 mg (has no administration in time range)    Or  acetaminophen (TYLENOL) suppository 650 mg (has no administration in time range)  ondansetron (ZOFRAN) tablet 4 mg (has no administration in time range)    Or   ondansetron (ZOFRAN) injection 4 mg (has no administration in time range)  atorvastatin (LIPITOR) tablet 80 mg (80 mg Oral Given 06/15/23 0832)  clopidogrel (PLAVIX) tablet 75 mg (75 mg Oral Given 06/15/23 0831)  famotidine (PEPCID) tablet 20 mg (20 mg Oral Given 06/15/23 0832)  feeding supplement (BOOST HIGH PROTEIN) liquid 237 mL (237 mLs Oral Given 06/15/23 0833)  nebivolol (BYSTOLIC) tablet 5 mg (5 mg Oral Given 06/15/23 0841)  ampicillin-sulbactam (UNASYN) 1.5 g in sodium chloride 0.9 % 100 mL IVPB (has no administration in time range)  lactated ringers infusion ( Intravenous New Bag/Given 06/15/23 0025)  ondansetron (ZOFRAN) injection 4 mg (4 mg Intravenous Given 06/14/23 1433)  sodium chloride 0.9 % bolus 500 mL (0 mLs Intravenous Stopped 06/14/23 1554)  acetaminophen (TYLENOL) tablet 1,000 mg (1,000 mg Oral Given 06/14/23 2105)  oxyCODONE (Oxy IR/ROXICODONE) immediate release tablet 2.5 mg (2.5 mg Oral Given 06/14/23 2105)  Ampicillin-Sulbactam (UNASYN) 3 g in sodium chloride 0.9 % 100 mL IVPB (0 g Intravenous Stopped 06/14/23 2339)    Mobility walks with device     Focused Assessments     R Recommendations: See Admitting Provider Note  Report given to:   Additional Notes:  Usually ambulatory with walker at home. Was unable  to void with purewick. Assisted to bed side commode with 2 person assist. On bed alarm. Confused at baseline and had tried to get up. Family with patient during the day.

## 2024-02-14 ENCOUNTER — Telehealth: Payer: Self-pay | Admitting: Cardiology

## 2024-02-14 ENCOUNTER — Other Ambulatory Visit: Payer: Self-pay | Admitting: Cardiology

## 2024-02-14 MED ORDER — CLOPIDOGREL BISULFATE 75 MG PO TABS: 75.0000 mg | ORAL_TABLET | Freq: Every day | ORAL | 0 refills | Status: DC

## 2024-02-14 MED ORDER — FUROSEMIDE 40 MG PO TABS: 20.0000 mg | ORAL_TABLET | Freq: Every day | ORAL | 0 refills | Status: DC

## 2024-02-14 MED ORDER — ATORVASTATIN CALCIUM 80 MG PO TABS: 80.0000 mg | ORAL_TABLET | Freq: Every day | ORAL | 0 refills | Status: DC

## 2024-02-14 MED ORDER — NEBIVOLOL HCL 10 MG PO TABS: ORAL_TABLET | ORAL | 0 refills | Status: DC

## 2024-02-14 NOTE — Telephone Encounter (Signed)
 Rx refill sent to pharmacy.

## 2024-02-14 NOTE — Telephone Encounter (Signed)
*  STAT* If patient is at the pharmacy, call can be transferred to refill team.   1. Which medications need to be refilled? (please list name of each medication and dose if known)   clopidogrel  (PLAVIX ) 75 MG tablet  atorvastatin  (LIPITOR ) 80 MG tablet  furosemide  (LASIX ) 40 MG tablet  nebivolol  (BYSTOLIC ) 10 MG tablet   2. Would you like to learn more about the convenience, safety, & potential cost savings by using the [facility]?   3. Are you open to using the [facility] (Type [facility]. ).  4. Which pharmacy/location (including street and city if local pharmacy) is medication to be sent to?  [facility] - [place], [place] - [address]   5. Do they need a 30 day or 90 day supply?   90 day  Daughter Lessie) stated patient is running out of these medications.  Patient has appointment scheduled on 8/19 with DOROTHA Hoover, NP.

## 2024-02-14 NOTE — Telephone Encounter (Signed)
 Pt's medications were sent to pt's pharmacy as requested. Confirmation received.

## 2024-03-20 ENCOUNTER — Other Ambulatory Visit: Payer: Self-pay | Admitting: Cardiology

## 2024-03-27 NOTE — Progress Notes (Deleted)
  Cardiology Office Note   Date:  03/27/2024  ID:  Sophia Snyder, DOB [birth year], MRN [id number] PCP: Sherre Clapper, MD  [facility] Providers Cardiologist:  Ozell Fell, MD Cardiology APP:  Lelon Glendia DASEN, PA-C { Click to update primary MD,subspecialty MD or APP then REFRESH:1}    History of Present Illness Sophia Snyder is a 89 y.o. female with a past medical history of CAD s/p DES x 2 to the LAD and known CTO of RCA, HFrEF, hypertension, CKD stage IV, dyslipidemia.  02/27/2019 echo EF 30 to 35%, mild increase in LVH, severe hypokinesis 12/31/2015 echo EF 30 to 35% 12/30/2015 echo EF 30 to 35% 12/28/2015 cardiac cath acute total occlusion of the proximal LAD the setting of acute anterior MI  [facility] with [facility] after she was admitted with a STEMI on 12/28/2015 requiring DES x 2 to proximal LAD, CTO of the RCA, EF 20 to 25% at cath, slightly increased to 30 to 35% after revascularization.  She established with Dr. Bernie in 2024, secondary to closer proximity to her home, most recently evaluated by him on 03/03/2023, doing well overall from a cardiac perspective but she was dealing with persistent fatigue, advised to follow-up in 6 months.  ROS: ***  Studies Reviewed      *** Risk Assessment/Calculations {Does this patient have ATRIAL FIBRILLATION?:[id number]} No BP recorded.  {Refresh Note OR Click here to enter BP  :1}***       Physical Exam VS:  There were no vitals taken for this visit.       Wt Readings from Last 3 Encounters:  06/14/23 104 lb (47.2 kg)  04/13/23 103 lb (46.7 kg)  03/03/23 106 lb (48.1 kg)    GEN: Well nourished, well developed in no acute distress NECK: No JVD; No carotid bruits CARDIAC: ***RRR, no murmurs, rubs, gallops RESPIRATORY:  Clear to auscultation without rales, wheezing or rhonchi  ABDOMEN: Soft, non-tender, non-distended EXTREMITIES:  No edema; No deformity   ASSESSMENT AND PLAN CAD -  HFrEF -  Hypertension  -     {Are you ordering a CV Procedure (e.g. stress test, cath, DCCV, TEE, etc)?   Press F2        :[id number]}  Dispo: ***  Signed, Delon JAYSON Hoover, NP

## 2024-03-28 ENCOUNTER — Ambulatory Visit: Admitting: Cardiology

## 2024-03-28 ENCOUNTER — Other Ambulatory Visit: Payer: Self-pay

## 2024-03-28 DIAGNOSIS — Z95820 Peripheral vascular angioplasty status with implants and grafts: Secondary | ICD-10-CM

## 2024-03-28 DIAGNOSIS — I5020 Unspecified systolic (congestive) heart failure: Secondary | ICD-10-CM

## 2024-03-28 DIAGNOSIS — I2510 Atherosclerotic heart disease of native coronary artery without angina pectoris: Secondary | ICD-10-CM | POA: Insufficient documentation

## 2024-03-28 DIAGNOSIS — I10 Essential (primary) hypertension: Secondary | ICD-10-CM

## 2024-03-28 DIAGNOSIS — K219 Gastro-esophageal reflux disease without esophagitis: Secondary | ICD-10-CM | POA: Insufficient documentation

## 2024-03-28 DIAGNOSIS — E785 Hyperlipidemia, unspecified: Secondary | ICD-10-CM

## 2024-04-04 ENCOUNTER — Ambulatory Visit: Admitting: Cardiology

## 2024-06-17 DEATH — deceased
